# Patient Record
Sex: Male | Born: 1935 | Race: White | Hispanic: No | Marital: Married | Smoking: Never smoker
Health system: Southern US, Community
[De-identification: ages and names within clinical notes are randomized; demographics above are authoritative.]

## PROBLEM LIST (undated history)

## (undated) DIAGNOSIS — C61 Malignant neoplasm of prostate: Secondary | ICD-10-CM

## (undated) DIAGNOSIS — I5042 Chronic combined systolic (congestive) and diastolic (congestive) heart failure: Secondary | ICD-10-CM

## (undated) DIAGNOSIS — I255 Ischemic cardiomyopathy: Secondary | ICD-10-CM

## (undated) DIAGNOSIS — N183 Chronic kidney disease, stage 3 unspecified: Secondary | ICD-10-CM

## (undated) DIAGNOSIS — E785 Hyperlipidemia, unspecified: Secondary | ICD-10-CM

## (undated) DIAGNOSIS — I110 Hypertensive heart disease with heart failure: Secondary | ICD-10-CM

## (undated) DIAGNOSIS — N200 Calculus of kidney: Secondary | ICD-10-CM

## (undated) DIAGNOSIS — I2510 Atherosclerotic heart disease of native coronary artery without angina pectoris: Secondary | ICD-10-CM

## (undated) DIAGNOSIS — I4819 Other persistent atrial fibrillation: Secondary | ICD-10-CM

## (undated) DIAGNOSIS — Z9289 Personal history of other medical treatment: Secondary | ICD-10-CM

## (undated) DIAGNOSIS — I10 Essential (primary) hypertension: Secondary | ICD-10-CM

## (undated) DIAGNOSIS — E119 Type 2 diabetes mellitus without complications: Secondary | ICD-10-CM

## (undated) HISTORY — PX: CARDIAC CATHETERIZATION: SHX172

## (undated) HISTORY — PX: PROSTATE BIOPSY: SHX241

## (undated) HISTORY — PX: CATARACT EXTRACTION W/ INTRAOCULAR LENS  IMPLANT, BILATERAL: SHX1307

## (undated) HISTORY — DX: Hypertensive heart disease with heart failure: I11.0

## (undated) HISTORY — DX: Essential (primary) hypertension: I10

## (undated) HISTORY — DX: Personal history of other medical treatment: Z92.89

## (undated) HISTORY — PX: LAPAROSCOPIC CHOLECYSTECTOMY: SUR755

## (undated) HISTORY — DX: Other persistent atrial fibrillation: I48.19

## (undated) HISTORY — DX: Atherosclerotic heart disease of native coronary artery without angina pectoris: I25.10

## (undated) HISTORY — DX: Chronic combined systolic (congestive) and diastolic (congestive) heart failure: I50.42

## (undated) HISTORY — DX: Hyperlipidemia, unspecified: E78.5

---

## 2004-08-04 ENCOUNTER — Ambulatory Visit: Payer: Self-pay | Admitting: Internal Medicine

## 2005-06-18 DIAGNOSIS — I2510 Atherosclerotic heart disease of native coronary artery without angina pectoris: Secondary | ICD-10-CM

## 2005-06-18 HISTORY — DX: Atherosclerotic heart disease of native coronary artery without angina pectoris: I25.10

## 2005-06-18 HISTORY — PX: CORONARY ANGIOPLASTY WITH STENT PLACEMENT: SHX49

## 2005-08-06 ENCOUNTER — Ambulatory Visit: Payer: Self-pay | Admitting: Cardiology

## 2005-08-07 ENCOUNTER — Ambulatory Visit: Payer: Self-pay

## 2005-08-16 ENCOUNTER — Ambulatory Visit: Payer: Self-pay | Admitting: *Deleted

## 2005-08-17 ENCOUNTER — Ambulatory Visit: Payer: Self-pay | Admitting: Internal Medicine

## 2005-08-17 ENCOUNTER — Ambulatory Visit (HOSPITAL_COMMUNITY): Admission: RE | Admit: 2005-08-17 | Discharge: 2005-08-17 | Payer: Self-pay | Admitting: Orthopedic Surgery

## 2005-08-20 ENCOUNTER — Ambulatory Visit (HOSPITAL_COMMUNITY): Admission: RE | Admit: 2005-08-20 | Discharge: 2005-08-21 | Payer: Self-pay | Admitting: Cardiology

## 2005-08-20 ENCOUNTER — Ambulatory Visit: Payer: Self-pay | Admitting: Cardiology

## 2005-08-29 ENCOUNTER — Ambulatory Visit: Payer: Self-pay | Admitting: Cardiology

## 2005-12-26 ENCOUNTER — Ambulatory Visit: Payer: Self-pay | Admitting: *Deleted

## 2006-04-30 ENCOUNTER — Ambulatory Visit: Payer: Self-pay | Admitting: *Deleted

## 2006-06-14 ENCOUNTER — Ambulatory Visit: Payer: Self-pay

## 2006-10-02 ENCOUNTER — Ambulatory Visit: Payer: Self-pay | Admitting: *Deleted

## 2007-08-14 ENCOUNTER — Ambulatory Visit: Payer: Self-pay | Admitting: Cardiovascular Disease

## 2007-11-27 ENCOUNTER — Ambulatory Visit: Payer: Self-pay

## 2008-08-17 ENCOUNTER — Encounter: Payer: Self-pay | Admitting: Cardiovascular Disease

## 2008-08-17 ENCOUNTER — Ambulatory Visit: Payer: Self-pay | Admitting: Cardiovascular Disease

## 2008-08-17 DIAGNOSIS — E785 Hyperlipidemia, unspecified: Secondary | ICD-10-CM | POA: Insufficient documentation

## 2008-08-17 DIAGNOSIS — I2510 Atherosclerotic heart disease of native coronary artery without angina pectoris: Secondary | ICD-10-CM

## 2008-08-17 DIAGNOSIS — I110 Hypertensive heart disease with heart failure: Secondary | ICD-10-CM | POA: Insufficient documentation

## 2008-08-17 HISTORY — DX: Hypertensive heart disease with heart failure: I11.0

## 2009-08-09 ENCOUNTER — Telehealth: Payer: Self-pay | Admitting: Cardiovascular Disease

## 2009-08-19 DIAGNOSIS — I739 Peripheral vascular disease, unspecified: Secondary | ICD-10-CM | POA: Insufficient documentation

## 2009-08-19 DIAGNOSIS — E119 Type 2 diabetes mellitus without complications: Secondary | ICD-10-CM | POA: Insufficient documentation

## 2009-08-23 ENCOUNTER — Telehealth (INDEPENDENT_AMBULATORY_CARE_PROVIDER_SITE_OTHER): Payer: Self-pay | Admitting: *Deleted

## 2009-08-23 ENCOUNTER — Encounter: Payer: Self-pay | Admitting: *Deleted

## 2009-08-24 ENCOUNTER — Ambulatory Visit: Payer: Self-pay | Admitting: Internal Medicine

## 2009-08-24 ENCOUNTER — Ambulatory Visit: Payer: Self-pay

## 2009-08-24 ENCOUNTER — Encounter (HOSPITAL_COMMUNITY): Admission: RE | Admit: 2009-08-24 | Discharge: 2009-10-18 | Payer: Self-pay | Admitting: Cardiovascular Disease

## 2009-08-26 ENCOUNTER — Ambulatory Visit: Payer: Self-pay | Admitting: Cardiovascular Disease

## 2010-06-21 ENCOUNTER — Telehealth: Payer: Self-pay | Admitting: Cardiovascular Disease

## 2010-07-18 NOTE — Assessment & Plan Note (Signed)
Summary: f1y   Visit Type:  1 year follow up Primary Provider:  Lajean Saver  CC:  No cardiac complaints.  History of Present Illness: the patient is a 76 year old gentleman with coronary artery disease who underwent PCI with a drug-eluting stent in the LAD in 2007. Since he was asymptomatic at initial presentation, he has undergone serial Myoview scanning since then. Recent results were reviewed and the findings were a low risk result without significant ischemia. His exercise tolerance was fairly poor and he developed transient SVT in recovery (beta blocker was held prior to the study).  The patient continues to do well from a cardiac standpoint. He has no symptoms at present. He specifically denies chest pain, dyspnea, edema, lightheadedness, palpitations, or near syncope. He lives in [facility] and maintains an active lifestyle. He plays golf at least twice/week. He also walks 2 miles every day, taking him about 40 minutes.  Current Medications (verified): 1)  Altace 10 Mg Caps (Ramipril) .... Take 1 Capsule By Mouth Once A Day 2)  Co-Enzyme Q-10 30 Mg Caps (Coenzyme Q10) .... Take 1 Capsule By Mouth Once A Day 3)  Plavix 75 Mg Tabs (Clopidogrel Bisulfate) .... Take One Tablet By Mouth Daily 4)  Metformin Hcl 500 Mg Tabs (Metformin Hcl) .... Take 1 Tablet By Mouth Three Times A Day 5)  Lipitor 20 Mg Tabs (Atorvastatin Calcium) .... Take One Tablet By Mouth Daily. 6)  Toprol Xl 50 Mg Xr24h-Tab (Metoprolol Succinate) .... Take 1 Tablet By Mouth Once A Day 7)  Glipizide 5 Mg Tabs (Glipizide) .... Take One Tab P.o. Two Times A Day 8)  Aspirin 81 Mg Tbec (Aspirin) .... Take One Tablet By Mouth Daily 9)  Zyrtec-D Allergy & Congestion 5-120 Mg Xr12h-Tab (Cetirizine-Pseudoephedrine) .... As Needed 10)  Tylenol Extra Strength 500 Mg Tabs (Acetaminophen) .... As Needed  Allergies (verified): No Known Drug Allergies  Past History:  Past medical history reviewed for relevance to current  acute and chronic problems.  Past Medical History: Current Problems:  CAD, NATIVE VESSEL (ICD-414.01)- PCI with a drug-eluting stent 2007 to treat severe LAD stenosis HYPERTENSION, BENIGN (ICD-401.1) HYPERLIPIDEMIA-MIXED (ICD-272.4) DIABETES MELLITUS, TYPE II (ICD-250.00)  Review of Systems       Negative except as per HPI   Vital Signs:  Patient profile:   76 year old male Height:      71 inches Weight:      213.50 pounds BMI:     29.88 Pulse rate:   68 / minute Pulse rhythm:   regular Resp:     18 per minute BP sitting:   150 / 90  (left arm)  Vitals Entered By: Vikki Ports (August 26, 2009 10:09 AM)  Physical Exam  General:  Pt is alert and oriented, in no acute distress. HEENT: normal Neck: normal carotid upstrokes without bruits, JVP normal Lungs: CTA CV: RRR without murmur or gallop Abd: soft, NT, positive BS, no bruit, no organomegaly Ext: no clubbing, cyanosis, or edema. peripheral pulses 2+ and equal Skin: warm and dry without rash    Impression & Recommendations:  Problem # 1:  CAD, NATIVE VESSEL (ICD-414.01) Stable without angina. Recent Myoview stress test negative for ischemia. He is several years out from PCI and I advised he could discontinue plavix at this point. We discussed risks/benefit of continued plavix, including the very low chance of late stent thrombosis versus increased bleeding risk if he continues long-term dual antiplatelet Rx. He wishes to discontinue the drug. Will f/u in  one year with a nonimaging exercise treadmill stress test to be the same day as his office visit since he lives out of town.  The following medications were removed from the medication list:    Plavix 75 Mg Tabs (Clopidogrel bisulfate) .Marland Kitchen... Take one tablet by mouth daily His updated medication list for this problem includes:    Altace 10 Mg Caps (Ramipril) .Marland Kitchen... Take 1 capsule by mouth once a day    Toprol Xl 50 Mg Xr24h-tab (Metoprolol succinate) .Marland Kitchen... Take 1 tablet  by mouth once a day    Aspirin 81 Mg Tbec (Aspirin) .Marland Kitchen... Take one tablet by mouth daily  Problem # 2:  HYPERTENSION, BENIGN (ICD-401.1) BP elevated today but he reports good BP control at home with BP's averaging 120's over 60's. States office BP at his PCP's office 2 weeks ago was within normal limits. Monitor for now.  His updated medication list for this problem includes:    Altace 10 Mg Caps (Ramipril) .Marland Kitchen... Take 1 capsule by mouth once a day    Toprol Xl 50 Mg Xr24h-tab (Metoprolol succinate) .Marland Kitchen... Take 1 tablet by mouth once a day    Aspirin 81 Mg Tbec (Aspirin) .Marland Kitchen... Take one tablet by mouth daily  BP today: 150/90 Prior BP: 120/80 (08/17/2008)  Problem # 3:  HYPERLIPIDEMIA-MIXED (ICD-272.4) Managed by Dr Venetia Night. LDL goal less than 100 mg/dL.  His updated medication list for this problem includes:    Lipitor 20 Mg Tabs (Atorvastatin calcium) .Marland Kitchen... Take one tablet by mouth daily.  Patient Instructions: 1)  Your physician has recommended you make the following change in your medication: STOP Plavix 2)  Your physician wants you to follow-up in:  1 YEAR.  You will receive a reminder letter in the mail two months in advance. If you don't receive a letter, please call our office to schedule the follow-up appointment. 3)  Your physician has requested that you have an exercise tolerance test in 1 YEAR.  For further information please visit [email or web address]  Please also follow instruction sheet, as given. Prescriptions: LIPITOR 20 MG TABS (ATORVASTATIN CALCIUM) Take one tablet by mouth daily.  #90 x 3   Entered by:   Julieta Gutting, RN, BSN   Authorized by:   Norva Karvonen, MD   Signed by:   Julieta Gutting, RN, BSN on 08/26/2009   Method used:   Electronically to        [facility]* (mail-order)             ,          Ph: [phone]       Fax: [phone]   RxID:   [id number] TOPROL XL 50 MG XR24H-TAB (METOPROLOL SUCCINATE) Take 1 tablet by mouth once a day  #90 x  3   Entered by:   Vikki Ports   Authorized by:   Norva Karvonen, MD   Signed by:   Vikki Ports on 08/26/2009   Method used:   Print then Give to Patient   RxID:   [id number] ALTACE 10 MG CAPS (RAMIPRIL) Take 1 capsule by mouth once a day  #90 x 3   Entered by:   Vikki Ports   Authorized by:   Norva Karvonen, MD   Signed by:   Vikki Ports on 08/26/2009   Method used:   Print then Give to Patient   RxID:   [id number]

## 2010-07-18 NOTE — Progress Notes (Signed)
Summary: stress test (3/11 appt)  Phone Note Call from Patient Call back at Home Phone [phone]   Caller: Patient Reason for Call: Talk to Nurse, Talk to Doctor Summary of Call: dose pt need to have stress test prior to annual office visit Initial call taken by: Omer Jack,  August 09, 2009 4:45 PM  Follow-up for Phone Call        yes - pt was asymptomatic at presentation and has moderate residual CAD Follow-up by: Norva Karvonen, MD,  August 15, 2009 1:37 PM  Additional Follow-up for Phone Call Additional follow up Details #1::        I spoke with the pt and Dr Excell Seltzer would like him to have an Exercise Stress Myoview prior to his appt on 08/26/09.  I have already given the pt instructions for this test verbally over the phone.  The pt is coming from [facility] for his appt and would like to get this test on 3/9 or 3/10.  I will place the order and have a scheduler contact the pt.  Additional Follow-up by: Julieta Gutting, RN, BSN,  August 16, 2009 5:38 PM

## 2010-07-18 NOTE — Progress Notes (Signed)
Summary: Nuclear Pre-Procedure  Phone Note Outgoing Call Call back at Home Phone [phone]   Call placed by: Stanton Kidney, EMT-P,  August 23, 2009 3:01 PM Action Taken: Phone Call Completed Summary of Call: Left message with information on Myoview Information Sheet (see scanned document for details).     Nuclear Med Background Indications for Stress Test: Evaluation for Ischemia, Stent Patency, PTCA Patency   History: Angioplasty, Heart Catheterization, Myocardial Perfusion Study, Stents  History Comments: '98 Angioplasty: LAD 3/07 Heart Cath: EF= 55-60%, LAD 90% > LAD Stent 6/09 MPS: EF=53%, low risk     Nuclear Pre-Procedure Cardiac Risk Factors: Family History - CAD, Hypertension, Lipids, NIDDM Height (in): 71

## 2010-07-18 NOTE — Assessment & Plan Note (Signed)
Summary: Cardiology Nuclear Study  Nuclear Med Background Indications for Stress Test: Evaluation for Ischemia, Stent Patency, PTCA Patency   History: Angioplasty, Heart Catheterization, Myocardial Perfusion Study, Stents  History Comments: '98 Angioplasty: LAD 3/07 Heart Cath: EF= 55-60%, LAD 90% > LAD Stent 6/09 MPS: EF=53%, low risk  Symptoms: DOE, Palpitations    Nuclear Pre-Procedure Cardiac Risk Factors: Family History - CAD, Hypertension, Lipids, NIDDM Caffeine/Decaff Intake: None NPO After: 8:00 PM Lungs: clear IV 0.9% NS with Angio Cath: 20g     IV Site: (R) AC IV Started by: Stanton Kidney EMT-P Chest Size (in) 46     Height (in): 71 Weight (lb): 208 BMI: 29.11 Tech Comments: Toprol held > 24 hours, per Patient.    The patient went into psvt as soon as he went into recovery. CBG = 138 @ 7:30 am this day.          Vagal maneuvers were used to convert the patient back into                                                                a NRS.  Nuclear Med Study 1 or 2 day study:  1 day     Stress Test Type:  Stress Reading MD:  Arvilla Meres, MD     Referring MD:  M.Cooper Resting Radionuclide:  Technetium 32m Tetrofosmin     Resting Radionuclide Dose:  11.0 mCi  Stress Radionuclide:  Technetium 56m Tetrofosmin     Stress Radionuclide Dose:  33.0 mCi   Stress Protocol Exercise Time (min):  4:45 min     Max HR:  142 bpm     Predicted Max HR:  148 bpm  Max Systolic BP: 193 mm Hg     Percent Max HR:  95.95 %     METS: 6.6 Rate Pressure Product:  74259    Stress Test Technologist:  Milana Na EMT-P     Nuclear Technologist:  Burna Mortimer Deal RT-N  Rest Procedure  Myocardial perfusion imaging was performed at rest 45 minutes following the intravenous administration of Myoview Technetium 56m Tetrofosmin.  Stress Procedure  The patient exercised for 4:45. The patient stopped due to fatifue, sob, and denied any chest pain.  There were no significant ST-T wave changes.  There were freq pvcs, multifocal, cuplets, and rare pacs. As soon as the patient went into recovery, he went into psvt. Myoview was injected at peak exercise and myocardial perfusion imaging was performed after a brief delay.  QPS Raw Data Images:  movPatient motion noted; appropriate software correction applied. Stress Images:  Inferior thinning Rest Images:  Inferior thinning Subtraction (SDS):  Mild thinning of the inferior wall that improves somewhat with stress. Likely diaphragmatic attenuation. No ischemia. Transient Ischemic Dilatation:  .93  (Normal <1.22)  Lung/Heart Ratio:  .31  (Normal <0.45)  Quantitative Gated Spect Images QGS EDV:  93 ml QGS ESV:  38 ml QGS EF:  59 % QGS cine images:  Normal  Findings Low risk nuclear study  Evidence for inferior infarct     Overall Impression  Exercise Capacity: Good exercise capacity. BP Response: Normal blood pressure response. Clinical Symptoms: Dyspnea ECG Impression: No significant ST segment change suggestive of ischemia. +PVCs and couplets. Overall Impression: Low risk stress nuclear study.  Overall Impression Comments: Mild thinning of the inferior wall that improves somewhat with stress. Likely diaphragmatic attenuation. No ischemia.  Appended Document: Cardiology Nuclear Study Patient developed SVT at 160 bpm in recovery which broke with vagal maneuvers.  Appended Document: Cardiology Nuclear Study reviewed - low risk result.  Appended Document: Cardiology Nuclear Study Pt aware of results at 08/26/09 appointment with Dr Excell Seltzer.

## 2010-07-20 NOTE — Progress Notes (Signed)
Summary: rx refill  Phone Note Refill Request Message from:  Patient on June 21, 2010 8:34 AM  Refills Requested: Medication #1:  LIPITOR 20 MG TABS Take one tablet by mouth daily. care mark # [phone]   Method Requested: Telephone to Pharmacy Initial call taken by: Roe Coombs,  June 21, 2010 8:34 AM  Follow-up for Phone Call        Rx faxed to pharmacy. Vikki Ports  June 21, 2010 9:26 AM     Prescriptions: LIPITOR 20 MG TABS (ATORVASTATIN CALCIUM) Take one tablet by mouth daily.  #90 x 3   Entered by:   Vikki Ports   Authorized by:   Norva Karvonen, MD   Signed by:   Vikki Ports on 06/21/2010   Method used:   Faxed to ...       [facility]* (mail-order)       [address] [place], [place]  [address]       Ph: [phone]       Fax: [phone]   RxID:   [id number]

## 2010-08-29 ENCOUNTER — Encounter (INDEPENDENT_AMBULATORY_CARE_PROVIDER_SITE_OTHER): Payer: Medicare Other | Admitting: Cardiovascular Disease

## 2010-08-29 ENCOUNTER — Encounter (INDEPENDENT_AMBULATORY_CARE_PROVIDER_SITE_OTHER): Payer: Medicare Other

## 2010-08-29 ENCOUNTER — Encounter: Payer: Self-pay | Admitting: Cardiovascular Disease

## 2010-08-29 DIAGNOSIS — I2510 Atherosclerotic heart disease of native coronary artery without angina pectoris: Secondary | ICD-10-CM

## 2010-08-29 DIAGNOSIS — R0989 Other specified symptoms and signs involving the circulatory and respiratory systems: Secondary | ICD-10-CM

## 2010-09-05 NOTE — Miscellaneous (Signed)
Summary: Follow-up from [facility]  Clinical Lists Changes  Medications: Changed medication from ALTACE 10 MG CAPS (RAMIPRIL) Take 1 capsule by mouth once a day to ALTACE 10 MG CAPS (RAMIPRIL) Take 1 capsule by mouth once a day - Signed Changed medication from TOPROL XL 50 MG XR24H-TAB (METOPROLOL SUCCINATE) Take 1 tablet by mouth once a day to TOPROL XL 50 MG XR24H-TAB (METOPROLOL SUCCINATE) Take 1 tablet by mouth once a day - Signed Changed medication from LIPITOR 20 MG TABS (ATORVASTATIN CALCIUM) Take one tablet by mouth daily. to LIPITOR 20 MG TABS (ATORVASTATIN CALCIUM) Take one tablet by mouth daily. - Signed Rx of ALTACE 10 MG CAPS (RAMIPRIL) Take 1 capsule by mouth once a day;  #90 x 3;  Signed;  Entered by: Julieta Gutting, RN, BSN;  Authorized by: Norva Karvonen, MD;  Method used: Electronically to [facility]*, , ,   , Ph: [phone], Fax: [phone] Rx of TOPROL XL 50 MG XR24H-TAB (METOPROLOL SUCCINATE) Take 1 tablet by mouth once a day;  #90 x 3;  Signed;  Entered by: Julieta Gutting, RN, BSN;  Authorized by: Norva Karvonen, MD;  Method used: Electronically to [facility]*, , ,   , Ph: [phone], Fax: [phone] Rx of ALTACE 10 MG CAPS (RAMIPRIL) Take 1 capsule by mouth once a day;  #90 x 3;  Signed;  Entered by: Julieta Gutting, RN, BSN;  Authorized by: Norva Karvonen, MD;  Method used: Print then Give to Patient Rx of LIPITOR 20 MG TABS (ATORVASTATIN CALCIUM) Take one tablet by mouth daily.;  #90 x 3;  Signed;  Entered by: Julieta Gutting, RN, BSN;  Authorized by: Norva Karvonen, MD;  Method used: Print then Give to Patient Rx of TOPROL XL 50 MG XR24H-TAB (METOPROLOL SUCCINATE) Take 1 tablet by mouth once a day;  #90 x 3;  Signed;  Entered by: Julieta Gutting, RN, BSN;  Authorized by: Norva Karvonen, MD;  Method used: Print then Give to Patient Observations: Added new observation of PI CARDIO: Your physician wants you to follow-up in:  1 YEAR.  You will receive a  reminder letter in the mail two months in advance. If you don't receive a letter, please call our office to schedule the follow-up appointment. Your physician has requested that you have a Lexiscan myoview in 1 YEAR.  For further information please visit [email or web address]  Please follow instruction sheet, as given. Your physician recommends that you continue on your current medications as directed. Please refer to the Current Medication list given to you today. (08/29/2010 11:38)    Prescriptions: TOPROL XL 50 MG XR24H-TAB (METOPROLOL SUCCINATE) Take 1 tablet by mouth once a day  #90 x 3   Entered by:   Julieta Gutting, RN, BSN   Authorized by:   Norva Karvonen, MD   Signed by:   Julieta Gutting, RN, BSN on 08/29/2010   Method used:   Print then Give to Patient   RxID:   [id number] LIPITOR 20 MG TABS (ATORVASTATIN CALCIUM) Take one tablet by mouth daily.  #90 x 3   Entered by:   Julieta Gutting, RN, BSN   Authorized by:   Norva Karvonen, MD   Signed by:   Julieta Gutting, RN, BSN on 08/29/2010   Method used:   Print then Give to Patient   RxID:   [id number] ALTACE 10 MG CAPS (RAMIPRIL) Take 1 capsule by mouth once a day  #90 x 3   Entered  by:   Julieta Gutting, RN, BSN   Authorized by:   Norva Karvonen, MD   Signed by:   Julieta Gutting, RN, BSN on 08/29/2010   Method used:   Print then Give to Patient   RxID:   [id number] TOPROL XL 50 MG XR24H-TAB (METOPROLOL SUCCINATE) Take 1 tablet by mouth once a day  #90 x 3   Entered by:   Julieta Gutting, RN, BSN   Authorized by:   Norva Karvonen, MD   Signed by:   Julieta Gutting, RN, BSN on 08/29/2010   Method used:   Electronically to        [facility]* (retail)             ,          Ph: [phone]       Fax: [phone]   RxID:   [id number] ALTACE 10 MG CAPS (RAMIPRIL) Take 1 capsule by mouth once a day  #90 x 3   Entered by:   Julieta Gutting, RN, BSN   Authorized by:   Norva Karvonen, MD   Signed  by:   Julieta Gutting, RN, BSN on 08/29/2010   Method used:   Electronically to        [facility]* (retail)             ,          Ph: [phone]       Fax: [phone]   RxID:   [id number]   I sent Toprol XL and Altace prescription to [facility] in error. I contacted [facility] and spoke with [facility] pharmacy tech and the pt's account with [facility] is inactive.  Because the account is inactive the prescriptions will not be generated.  Julieta Gutting, RN, BSN  August 29, 2010 11:46 AM    Patient Instructions: 1)  Your physician wants you to follow-up in:  1 YEAR.  You will receive a reminder letter in the mail two months in advance. If you don't receive a letter, please call our office to schedule the follow-up appointment. 2)  Your physician has requested that you have a Lexiscan myoview in 1 YEAR.  For further information please visit [email or web address]  Please follow instruction sheet, as given. 3)  Your physician recommends that you continue on your current medications as directed. Please refer to the Current Medication list given to you today.

## 2010-10-31 NOTE — Assessment & Plan Note (Signed)
[facility]                            CARDIOLOGY OFFICE NOTE   Rebecca, Motta CLETUS PARIS                        MRN:          [id number]  DATE:08/14/2007                            DOB:          [birth year]    Blakeley Margraf was seen at the [facility] Cardiology office on August 14, 2007.  Mr. Negro is a very nice, 76 year old gentleman with coronary  artery disease.  He underwent PCI with a drug-eluting stent to treat  severe LAD stenosis back in 2007.  Mr. Velardi has type 2 diabetes and at  his presentation, really had minimal symptoms.  He had a followup stress  nuclear study in December 2007, that showed no evidence of scar or  ischemia.   Mr. Pilch is doing quite well from a symptomatic standpoint.  He walks  and plays golf regularly and really has no symptoms.  He denies chest  pain, dyspnea, orthopnea, PND, edema, palpitations, lightheadedness or  syncope.  He lives in [facility] and is up here visiting his son  who lives in [place].  He has previously been followed by Dr. Glennon Hamilton and I will be following up with him for his cardiac care from  here forward.   CURRENT MEDICATIONS:  1. Altace 10 mg daily.  2. Coenzyme Q 10 daily.  3. Aspirin 325 mg daily.  4. Plavix 75 mg daily.  5. Lipitor 20 mg daily.  6. Toprol XL 50 mg daily.  7. Glipizide 2.5 mg twice daily.  8. Fish oil daily.   ALLERGIES:  No known drug allergies.   PHYSICAL EXAMINATION:  GENERAL:  The patient is alert and oriented in no  acute distress.  VITAL SIGNS:  Weight 211, blood pressure 140/80, heart rate 65,  respiratory rate 16.  HEENT:  Normal.  NECK:  Normal carotid upstrokes without bruits.  Jugular venous pressure  normal.  LUNGS:  Clear to auscultation bilaterally.  HEART:  Regular rate and rhythm without murmurs or gallops.  ABDOMEN:  Soft, obese, nontender, no bruits.  EXTREMITIES:  No clubbing, cyanosis or edema.  Peripheral pulses 2+ and  equal  throughout.   EKG shows normal sinus rhythm with left axis deviation, otherwise within  normal limits.   ASSESSMENT/PLAN:  Coronary artery disease, status post drug-eluting  stent, treatment of severe proximal left anterior descending stenosis in  2007.  Mr. Revak is doing well.  He is tolerating his medical therapy  without difficulty.  I have asked him to reduce his aspirin dose to 81  mg daily since he is over 1 year out from his percutaneous coronary  intervention procedure.  I would like him to remain on dual antiplatelet  therapy with aspirin and Plavix since he is tolerating it well.  Since  he was asymptomatic at the time of his initial presentation and had  severe left anterior descending stenosis, I think he warrants followup  stress testing.  We scheduled him for an exercise Myoview study in  approximately 3 months.  I would like to follow him on a yearly basis  since  he has regular care with his primary physician, Dr. Venetia Night.   For remaining problems including hypertension, dyslipidemia and type 2  diabetes, he is followed closely by Dr. Venetia Night who manages these  problems.  He requested prescriptions for his regular medications which  were written for him today.     Veverly Fells. Excell Seltzer, MD  Electronically Signed    MDC/MedQ  DD: 08/14/2007  DT: 08/15/2007  Job #: [id number]   cc:   Susanne Greenhouse

## 2010-11-03 NOTE — Discharge Summary (Signed)
NAMEHARTMAN, MINAHAN                 ACCOUNT NO.:  [id number]   MEDICAL RECORD NO.:  [id number]          PATIENT TYPE:  OIB   LOCATION:  6526                         FACILITY:  [facility]   PHYSICIAN:  Salvadore Farber, M.D. LHCDATE OF BIRTH:  12-28-1934   DATE OF ADMISSION:  08/20/2005  DATE OF DISCHARGE:  08/21/2005                                 DISCHARGE SUMMARY   PROCEDURES:  1.  Catheterization.  2.  Percutaneous intervention with a drug-eluting stent to 1 vessel.  3.  [employer].   PRIMARY DIAGNOSIS:  Abnormal Myoview.   SECONDARY DIAGNOSES:  1.  Status post percutaneous transluminal coronary angioplasty to a 90% left      anterior descending in 1998.  2.  Residual coronary artery disease of 90% in a subbranch of the obtuse      marginal, medical therapy.  3.  Preserved left ventricular function.  4.  Paroxysmal supraventricular tachycardia and a 4-beat run of ventricular      tachycardia post exercise.  5.  Hyperlipidemia.  6.  Non-insulin-dependent diabetes.  7.  Family history of coronary artery disease.  8.  Hypertension.  9.  History of intestinal obstruction.   HOSPITAL COURSE:  Mr. Perreira is a 76 year old male with known coronary artery  disease.  He was seen in the office and referred for outpatient Myoview;  this was abnormal, so he came in for a joint-venture cardiac  catheterization.  The cardiac catheterization showed high-grade stenosis in  the LAD and he came back to the hospital on August 20, 2005 for percutaneous  intervention.   Mr. Kall had a drug-eluting stent to the mid LAD with a CYPHER stent,  reducing the stenosis from 90% to 0%.  He had no other critical coronary  artery disease and his EF was within normal limits.   The next day, his post-procedure labs were stable and his EKG was unchanged.  His groin was without hematoma or bruit.  Mr. Mathieson was considered stable for  discharge on August 22, 2005 with outpatient followup arranged.  At that time,  Dr.  Gabriel Rung can review his labs and decide if an increased statin dose is  needed.   DISCHARGE INSTRUCTIONS:  1.  His activity level is to be increased gradually.  2.  He is to stick to a low-fat diabetic diet.  3.  He is to call the office for problems with the cath site.  4.  He is to follow up with Dr. Glennon Hamilton on August 29, 2005 at 9:45.  5.  He is to follow up with his family physician in [facility] as needed      or as scheduled.   DISCHARGE MEDICATIONS:  1.  Atenolol 25 mg a day.  2.  Glipizide 2.5 mg a day.  3.  Altace 10 mg a day.  4.  Aspirin 325 mg a day.  5.  Plavix 75 mg a day.  6.  Lipitor 10 mg a day.      Theodore Demark, P.A. [facility]      Salvadore Farber, M.D. [facility]  Electronically Signed    RB/MEDQ  D:  08/21/2005  T:  08/22/2005  Job:  [id number]   cc:   Alan Ripper, Kihei Vertis Kelch MD

## 2010-11-03 NOTE — Cardiovascular Report (Signed)
NAMEVERMON, GRAYS                 ACCOUNT NO.:  [id number]   MEDICAL RECORD NO.:  [id number]          PATIENT TYPE:  OIB   LOCATION:  6526                         FACILITY:  [facility]   PHYSICIAN:  Salvadore Farber, M.D. LHCDATE OF BIRTH:  [birth year]   DATE OF PROCEDURE:  08/20/2005  DATE OF DISCHARGE:  08/21/2005                              CARDIAC CATHETERIZATION   PROCEDURE:  Drug-eluting stent placement in the mid LAD.   INDICATIONS:  Mr. Pekala is a 76 year old gentleman with longstanding coronary  disease with a prior balloon angioplasty of the mid LAD. He recently had a  stress test markedly positive for anterior ischemia at relatively low  workload. Because of that, he underwent diagnostic angiography last week by  Dr. Riley Kill. That demonstrated a severe stenosis of the mid LAD. He is  referred for percutaneous revascularization, given the large territory  ischemia.   PROCEDURAL TECHNIQUE:  Informed consent was obtained. Under 1% lidocaine  local anesthesia, a 6-French sheath was placed in the right common femoral  artery using modified Seldinger technique. Anticoagulation was initiated  with bivalirudin.  ACT was confirmed to be greater than 225 seconds. The  patient had been preloaded with both aspirin and Plavix.   A 6-French CLS 3.5 guide was advanced over a wire and engaged in the ostium  of the left main. A Prowater wire was advanced into the distal LAD without  difficulty. The lesion was predilated using a 2.25 x 12 mm Maverick at 8  atmospheres. I then administered intracoronary nitroglycerin to better  assess the size of the distal LAD. The distal vessel was in the range of 2  to perhaps 2.25 mm. I elected to leave untreated the lesion just after the  second diagonal branch, as doing so would require a lengthy bare metal stent  due to the small size of the distal vessel. It would also potentially  compromise the large diagonal. I therefore chose to stent only the  proximal  portion, establishing flow into the diagonal, leaving at least a moderate  stenosis in the LAD downstream. I thus placed in 2.5 x 23 mm CYPHER and  deployed it at 14 atmospheres. I then post dilated the distal portion of  this using a 2.5 x 20 mm Quantum at 18 atmospheres and the proximal portion  of the stent using the same balloon at 24 atmospheres. Final angiography  demonstrated no residual stenosis, no dissection, and TIMI-3 flow to the  distal vasculature.   The arteriotomy was then closed using a StarClose device. Complete  hemostasis was obtained. He was transferred to holding room in stable  condition.   COMPLICATIONS:  None.   IMPRESSION/PLAN:  Successful percutaneous intervention on the mid left  anterior descending using a drug-eluting stent and reducing stenosis from  90% to 0%. Due to the drug-eluting stent, the patient should be maintained  on a combination of aspirin and Plavix indefinitely.      Salvadore Farber, M.D. [facility]  Electronically Signed     WED/MEDQ  D:  08/20/2005  T:  08/21/2005  Job:  [id number]  cc:   Dr. Georgeann Oppenheim [place], [place]   Cecil Cranker, M.D.  [address]  [address]  [place]  [place] [address]

## 2010-11-03 NOTE — Letter (Signed)
October 02, 2006     RE:  HIROSHI, KRUMMEL  MRN:  [id number]  /  DOB:  16-Oct-1934   ADDENDUM:   Stress Myoview in December 2007, revealed no scar ischemia.    Sincerely,      E. Graceann Congress, MD, [facility]    EJL/MedQ  DD: 10/02/2006  DT: 10/02/2006  Job #: [id number]

## 2010-12-19 ENCOUNTER — Encounter: Payer: Self-pay | Admitting: Cardiovascular Disease

## 2011-01-19 ENCOUNTER — Other Ambulatory Visit: Payer: Self-pay | Admitting: *Deleted

## 2011-01-19 MED ORDER — METOPROLOL SUCCINATE ER 50 MG PO TB24: 50.0000 mg | ORAL_TABLET | Freq: Every day | ORAL | Status: DC

## 2011-08-16 ENCOUNTER — Encounter: Payer: Self-pay | Admitting: *Deleted

## 2011-09-06 ENCOUNTER — Ambulatory Visit (INDEPENDENT_AMBULATORY_CARE_PROVIDER_SITE_OTHER): Payer: Medicare Other | Admitting: Cardiovascular Disease

## 2011-09-06 ENCOUNTER — Encounter: Payer: Self-pay | Admitting: Cardiovascular Disease

## 2011-09-06 VITALS — BP 148/80 | HR 77 | Ht 72.0 in | Wt 211.0 lb

## 2011-09-06 DIAGNOSIS — I10 Essential (primary) hypertension: Secondary | ICD-10-CM

## 2011-09-06 DIAGNOSIS — E785 Hyperlipidemia, unspecified: Secondary | ICD-10-CM

## 2011-09-06 DIAGNOSIS — I2510 Atherosclerotic heart disease of native coronary artery without angina pectoris: Secondary | ICD-10-CM

## 2011-09-06 MED ORDER — HYDROCHLOROTHIAZIDE 25 MG PO TABS: 25.0000 mg | ORAL_TABLET | Freq: Every day | ORAL | Status: DC

## 2011-09-06 MED ORDER — RAMIPRIL 10 MG PO CAPS: 10.0000 mg | ORAL_CAPSULE | Freq: Every day | ORAL | Status: DC

## 2011-09-06 MED ORDER — METOPROLOL SUCCINATE ER 50 MG PO TB24: 50.0000 mg | ORAL_TABLET | Freq: Every day | ORAL | Status: DC

## 2011-09-06 MED ORDER — ATORVASTATIN CALCIUM 20 MG PO TABS: 20.0000 mg | ORAL_TABLET | Freq: Every day | ORAL | Status: DC

## 2011-09-06 NOTE — Assessment & Plan Note (Signed)
The patient is stable without angina. He will continue on aspirin 81 mg daily. He takes atorvastatin for lipid-lowering and he is followed by his primary care physician. Recommend followup in 12 months.

## 2011-09-06 NOTE — Patient Instructions (Signed)
Your physician wants you to follow-up in: 12 months.  You will receive a reminder letter in the mail two months in advance. If you don't receive a letter, please call our office to schedule the follow-up appointment.  Your physician has recommended you make the following change in your medication: Start hydrochlorothiazide 25 mg by mouth daily  Have lab work done in 10-14 days--BMP

## 2011-09-06 NOTE — Assessment & Plan Note (Signed)
Blood pressure control is suboptimal. I recommended the addition of hydrochlorothiazide 25 mg daily. The patient currently takes ramipril and metoprolol.  He should have a metabolic panel checked in a few weeks to make sure that his renal function and potassium are stable and a prescription was written for the patient to have repeat blood work since he lives out of town.

## 2011-09-06 NOTE — Progress Notes (Signed)
HPI:  77 year old gentleman presenting for followup of coronary artery disease and hypertension.  He underwent PCI of a lesion in his LAD back in 2007 with a drug-eluting stent platform. He had exertional dyspnea prior to this but has no history of angina. He stopped Plavix after he was seen last office visit in 2011. The patient's labs are followed by his primary physician in [facility]. He feels well and has no chest pain or pressure. He denies dyspnea, edema, palpitations, or PND. He does some walking for exercise. He plays golf regularly. He notes that his type 2 diabetes has required additional hypoglycemic medication. He's been monitoring home blood pressures and they have been elevated in the 150s to 160s over 80s and 90s.  Outpatient Encounter Prescriptions as of 09/06/2011  Medication Sig Dispense Refill  . acetaminophen (TYLENOL) 500 MG tablet Take 500 mg by mouth every 6 (six) hours as needed.      Marland Kitchen aspirin 81 MG tablet Take 81 mg by mouth daily.      Marland Kitchen atorvastatin (LIPITOR) 20 MG tablet Take 1 tablet (20 mg total) by mouth daily.  90 tablet  3  . cetirizine-pseudoephedrine (ZYRTEC-D ALLERGY & CONGESTION) 5-120 MG per tablet Take 1 tablet by mouth as needed.      Marland Kitchen co-enzyme Q-10 30 MG capsule Take 30 mg by mouth daily.      Marland Kitchen glipiZIDE (GLUCOTROL) 5 MG tablet Take 5 mg by mouth 2 (two) times daily before a meal.      . linagliptin (TRADJENTA) 5 MG TABS tablet Take 5 mg by mouth daily.      . metFORMIN (GLUCOPHAGE) 500 MG tablet Take 500 mg by mouth 4 (four) times daily.       . metoprolol succinate (TOPROL XL) 50 MG 24 hr tablet Take 1 tablet (50 mg total) by mouth daily.  90 tablet  3  . ramipril (ALTACE) 10 MG capsule Take 1 capsule (10 mg total) by mouth daily.  90 capsule  3  . DISCONTD: atorvastatin (LIPITOR) 20 MG tablet Take 20 mg by mouth daily.      Marland Kitchen DISCONTD: metoprolol (TOPROL XL) 50 MG 24 hr tablet Take 1 tablet (50 mg total) by mouth daily.  30 tablet  6  . DISCONTD:  metoprolol succinate (TOPROL XL) 50 MG 24 hr tablet Take 1 tablet (50 mg total) by mouth daily.  90 tablet  3  . DISCONTD: ramipril (ALTACE) 10 MG capsule Take 10 mg by mouth daily.      . hydrochlorothiazide (HYDRODIURIL) 25 MG tablet Take 1 tablet (25 mg total) by mouth daily.  90 tablet  3    No Known Allergies  Past Medical History  Diagnosis Date  . CAD (coronary artery disease) 2007    pci with a drug-eluting stent 2007 to teeat severe lad stenosis  . HTN (hypertension)   . Hyperlipidemia   . Diabetes mellitus     ROS: Negative except as per HPI  BP 148/80  Pulse 77  Ht 6' (1.829 m)  Wt 95.709 kg (211 lb)  BMI 28.62 kg/m2  PHYSICAL EXAM: Pt is alert and oriented, NAD HEENT: normal Neck: JVP - normal, carotids 2+= without bruits Lungs: CTA bilaterally CV: RRR without murmur or gallop Abd: soft, NT, Positive BS, no hepatomegaly Ext: no C/C/E, distal pulses intact and equal Skin: warm/dry no rash  EKG:  Normal sinus rhythm 77 beats per minute, left axis deviation, no other abnormalities.  ASSESSMENT AND PLAN:

## 2011-09-06 NOTE — Assessment & Plan Note (Signed)
Followed by PCP, maintain on atorvastatin with goal LDL less than 100.

## 2012-04-25 ENCOUNTER — Other Ambulatory Visit: Payer: Self-pay | Admitting: Cardiovascular Disease

## 2012-08-13 ENCOUNTER — Other Ambulatory Visit: Payer: Self-pay | Admitting: Cardiovascular Disease

## 2012-09-12 ENCOUNTER — Encounter: Payer: Self-pay | Admitting: Cardiovascular Disease

## 2012-09-12 ENCOUNTER — Ambulatory Visit (INDEPENDENT_AMBULATORY_CARE_PROVIDER_SITE_OTHER): Payer: Medicare Other | Admitting: Cardiovascular Disease

## 2012-09-12 VITALS — BP 122/88 | HR 71 | Ht 71.5 in | Wt 206.8 lb

## 2012-09-12 DIAGNOSIS — I2510 Atherosclerotic heart disease of native coronary artery without angina pectoris: Secondary | ICD-10-CM

## 2012-09-12 MED ORDER — HYDROCHLOROTHIAZIDE 25 MG PO TABS: ORAL_TABLET | ORAL | Status: DC

## 2012-09-12 MED ORDER — ATORVASTATIN CALCIUM 20 MG PO TABS: 20.0000 mg | ORAL_TABLET | Freq: Every day | ORAL | Status: DC

## 2012-09-12 MED ORDER — RAMIPRIL 10 MG PO CAPS: ORAL_CAPSULE | ORAL | Status: DC

## 2012-09-12 MED ORDER — METOPROLOL SUCCINATE ER 50 MG PO TB24: 50.0000 mg | ORAL_TABLET | Freq: Every day | ORAL | Status: DC

## 2012-09-12 NOTE — Progress Notes (Signed)
HPI:  78 year old gentleman presenting for followup of coronary artery disease and hypertension. He underwent PCI of a lesion in his LAD back in 2007 with a drug-eluting stent platform. He had exertional dyspnea prior to this but has no history of angina. He stopped Plavix after he was seen last office visit in 2011.  The patient has had no cardiac problems since I saw him one year ago. He specifically denies chest pain, chest pressure, dyspnea, edema, or palpitations. He was hospitalized in December with a small bowel obstruction. This resolved without the need for surgery. He's been diagnosed with prostate cancer. He has undergone hormone injections and is going to start radiation therapy soon. There are no plans for surgery.  Outpatient Encounter Prescriptions as of 09/12/2012  Medication Sig Dispense Refill  . acetaminophen (TYLENOL) 500 MG tablet Take 500 mg by mouth every 6 (six) hours as needed.      Marland Kitchen aspirin 81 MG tablet Take 81 mg by mouth daily.      Marland Kitchen atorvastatin (LIPITOR) 20 MG tablet Take 1 tablet (20 mg total) by mouth daily.  90 tablet  3  . cetirizine-pseudoephedrine (ZYRTEC-D ALLERGY & CONGESTION) 5-120 MG per tablet Take 1 tablet by mouth as needed.      Marland Kitchen co-enzyme Q-10 30 MG capsule Take 30 mg by mouth daily.      Marland Kitchen glipiZIDE (GLUCOTROL) 5 MG tablet Take 5 mg by mouth 2 (two) times daily before a meal.      . hydrochlorothiazide (HYDRODIURIL) 25 MG tablet TAKE ONE TABLET BY MOUTH EVERY DAY  90 tablet  2  . linagliptin (TRADJENTA) 5 MG TABS tablet Take 5 mg by mouth daily.      . metFORMIN (GLUCOPHAGE) 500 MG tablet Take 500 mg by mouth 4 (four) times daily.       . ramipril (ALTACE) 10 MG capsule TAKE ONE CAPSULE BY MOUTH EVERY DAY  30 capsule  3  . metoprolol succinate (TOPROL XL) 50 MG 24 hr tablet Take 1 tablet (50 mg total) by mouth daily.  90 tablet  3   No facility-administered encounter medications on file as of 09/12/2012.    No Known Allergies  Past Medical  History  Diagnosis Date  . CAD (coronary artery disease) 2007    pci with a drug-eluting stent 2007 to teeat severe lad stenosis  . HTN (hypertension)   . Hyperlipidemia   . Diabetes mellitus     ROS: Negative except as per HPI  BP 122/88  Pulse 71  Ht 5' 11.5" (1.816 m)  Wt 93.804 kg (206 lb 12.8 oz)  BMI 28.44 kg/m2  SpO2 99%  PHYSICAL EXAM: Pt is alert and oriented, NAD HEENT: normal Neck: JVP - normal, carotids 2+= without bruits Lungs: CTA bilaterally CV: RRR without murmur or gallop Abd: soft, NT, Positive BS, no hepatomegaly Ext: no C/C/E, distal pulses intact and equal Skin: warm/dry no rash  EKG:  Normal sinus rhythm with sinus arrhythmia, heart rate 71 beats per minute, left axis deviation and probable age-indeterminate inferior MI.  ASSESSMENT AND PLAN: 1. Coronary artery disease, native vessel. The patient remained stable without anginal symptoms. He has been off of Plavix for several years now. He should remain on his current medical program.  2. Hypertension, essential. Blood pressure is well controlled after addition of hydrochlorothiazide last year. He should remain on ramipril, metoprolol succinate, and hydrochlorothiazide.  3. Hyperlipidemia. He is treated with atorvastatin 20 mg daily. Lipids are followed by his  primary care physician.  For followup extubation back in one year.  Tonny Bollman 09/12/2012 11:31 AM

## 2012-09-12 NOTE — Patient Instructions (Addendum)
Your physician wants you to follow-up in: 1 YEAR with Dr Cooper.  You will receive a reminder letter in the mail two months in advance. If you don't receive a letter, please call our office to schedule the follow-up appointment.  Your physician recommends that you continue on your current medications as directed. Please refer to the Current Medication list given to you today.  

## 2012-09-24 ENCOUNTER — Other Ambulatory Visit: Payer: Self-pay | Admitting: Cardiovascular Disease

## 2012-09-29 ENCOUNTER — Other Ambulatory Visit: Payer: Self-pay | Admitting: *Deleted

## 2012-09-29 DIAGNOSIS — I2510 Atherosclerotic heart disease of native coronary artery without angina pectoris: Secondary | ICD-10-CM

## 2012-09-29 MED ORDER — RAMIPRIL 10 MG PO CAPS: ORAL_CAPSULE | ORAL | Status: DC

## 2012-10-28 ENCOUNTER — Telehealth: Payer: Self-pay | Admitting: Cardiovascular Disease

## 2012-10-28 NOTE — Telephone Encounter (Signed)
I spoke with the pt's wife and she said the pt's PCP stopped HCTZ due to an elevated kidney function. They have scheduled the pt to follow-up again next month.  I made her aware that at this time stopping the HCTZ is appropriate. I instructed her to check the pt's BP 2 times per week to make sure that BP does not get too high.

## 2012-10-28 NOTE — Telephone Encounter (Signed)
New problem    pts wife concerned that [place] ([place] took [place] off of hydrochlorothiazide 25 mg & she wants to know if this is ok to be off of or should it be replaced with something different

## 2012-11-07 ENCOUNTER — Other Ambulatory Visit: Payer: Self-pay | Admitting: Cardiovascular Disease

## 2012-11-11 ENCOUNTER — Telehealth: Payer: Self-pay | Admitting: Cardiovascular Disease

## 2012-11-11 ENCOUNTER — Other Ambulatory Visit: Payer: Self-pay

## 2012-11-11 DIAGNOSIS — I2510 Atherosclerotic heart disease of native coronary artery without angina pectoris: Secondary | ICD-10-CM

## 2012-11-11 MED ORDER — METOPROLOL SUCCINATE ER 50 MG PO TB24: 50.0000 mg | ORAL_TABLET | Freq: Every day | ORAL | Status: DC

## 2012-11-11 NOTE — Telephone Encounter (Signed)
Left message on machine for pt to contact the office.   

## 2012-11-11 NOTE — Telephone Encounter (Signed)
New problem    receive new medication from his oncologist.   Procrit injection weekly . Wife read the literature , calling to discuss.

## 2012-11-12 NOTE — Telephone Encounter (Signed)
Follow-up:    Patient's wife called in to say that procrit was given to him at 20,000.  Please call back.

## 2012-11-12 NOTE — Telephone Encounter (Signed)
I spoke with the pt's wife and she wanted to make [place] aware that pt just started procrit yesterday.  This is being given to the pt by his oncologist.  Currently the pt is undergoing radiation.  I made her aware that the pt is okay to take Procrit and should follow recommendations by oncologist because this is the specialist.

## 2013-09-15 ENCOUNTER — Encounter: Payer: Self-pay | Admitting: Cardiovascular Disease

## 2013-09-15 ENCOUNTER — Ambulatory Visit (INDEPENDENT_AMBULATORY_CARE_PROVIDER_SITE_OTHER): Payer: Medicare Other | Admitting: Cardiovascular Disease

## 2013-09-15 VITALS — BP 124/70 | HR 78 | Ht 71.5 in | Wt 205.0 lb

## 2013-09-15 DIAGNOSIS — E785 Hyperlipidemia, unspecified: Secondary | ICD-10-CM

## 2013-09-15 DIAGNOSIS — I10 Essential (primary) hypertension: Secondary | ICD-10-CM

## 2013-09-15 DIAGNOSIS — I739 Peripheral vascular disease, unspecified: Secondary | ICD-10-CM

## 2013-09-15 DIAGNOSIS — I2510 Atherosclerotic heart disease of native coronary artery without angina pectoris: Secondary | ICD-10-CM

## 2013-09-15 MED ORDER — ATORVASTATIN CALCIUM 20 MG PO TABS: 20.0000 mg | ORAL_TABLET | Freq: Every day | ORAL | Status: DC

## 2013-09-15 MED ORDER — METOPROLOL SUCCINATE ER 50 MG PO TB24: 50.0000 mg | ORAL_TABLET | Freq: Every day | ORAL | Status: DC

## 2013-09-15 MED ORDER — RAMIPRIL 10 MG PO CAPS: ORAL_CAPSULE | ORAL | Status: DC

## 2013-09-15 NOTE — Progress Notes (Signed)
HPI:  79 year old gentleman presenting for followup of coronary artery disease and hypertension. He underwent PCI of a lesion in his LAD back in 2007 with a drug-eluting stent platform. He had exertional dyspnea prior to this but has no history of angina. He stopped Plavix after he was seen last office visit in 2011.  The patient is doing well from a cardiac perspective. He noticed some discomfort in his right upper chest with breathing cold air when he went for a brisk walk this past year. He thinks is just related to the cold air and has had no other problems. He denies dyspnea, central chest pressure, orthopnea, or PND. He does have generalized fatigue. He continues to have hormone injections for treatment of prostate cancer. He denies palpitations, lightheadedness, or syncope.   Outpatient Encounter Prescriptions as of 09/15/2013  Medication Sig  . acetaminophen (TYLENOL) 500 MG tablet Take 500 mg by mouth every 6 (six) hours as needed.  Marland Kitchen aspirin 81 MG tablet Take 81 mg by mouth daily.  Marland Kitchen atorvastatin (LIPITOR) 20 MG tablet Take 1 tablet (20 mg total) by mouth daily.  . cetirizine-pseudoephedrine (ZYRTEC-D ALLERGY & CONGESTION) 5-120 MG per tablet Take 1 tablet by mouth as needed.  Marland Kitchen co-enzyme Q-10 30 MG capsule Take 30 mg by mouth daily.  . Cyanocobalamin (VITAMIN B-12 IJ) Inject as directed every 30 (thirty) days.  Marland Kitchen Epoetin Alfa (PROCRIT IJ) Inject as directed as needed.  Marland Kitchen glipiZIDE (GLUCOTROL) 10 MG tablet Take 10 mg by mouth 2 (two) times daily before a meal.  . linagliptin (TRADJENTA) 5 MG TABS tablet Take 2.5 mg by mouth daily.   . metFORMIN (GLUCOPHAGE) 1000 MG tablet Take 1,000 mg by mouth 2 (two) times daily with a meal.  . metoprolol succinate (TOPROL-XL) 50 MG 24 hr tablet TAKE ONE TABLET BY MOUTH EVERY DAY  . ramipril (ALTACE) 10 MG capsule TAKE ONE CAPSULE BY MOUTH EVERY DAY  . vitamin B-12 (CYANOCOBALAMIN) 1000 MCG tablet Take 1,000 mcg by mouth daily.  . [DISCONTINUED]  glipiZIDE (GLUCOTROL) 5 MG tablet Take 5 mg by mouth 2 (two) times daily before a meal.  . [DISCONTINUED] metFORMIN (GLUCOPHAGE) 500 MG tablet Take 500 mg by mouth 4 (four) times daily.   . [DISCONTINUED] metoprolol succinate (TOPROL XL) 50 MG 24 hr tablet Take 1 tablet (50 mg total) by mouth daily.    No Known Allergies  Past Medical History  Diagnosis Date  . CAD (coronary artery disease) 2007    pci with a drug-eluting stent 2007 to teeat severe lad stenosis  . HTN (hypertension)   . Hyperlipidemia   . Diabetes mellitus     ROS: Negative except as per HPI  BP 124/70  Pulse 78  Ht 5' 11.5" (1.816 m)  Wt 205 lb (92.987 kg)  BMI 28.20 kg/m2  PHYSICAL EXAM: Pt is alert and oriented, NAD HEENT: normal Neck: JVP - normal, carotids 2+= without bruits Lungs: CTA bilaterally CV: RRR without murmur or gallop Abd: soft, NT, Positive BS, no hepatomegaly Ext: no C/C/E, distal pulses intact and equal Skin: warm/dry no rash  EKG:  Normal sinus rhythm 78 beats per minute, age indeterminate inferior MI, otherwise within normal limits. No change from last years tracing.  ASSESSMENT AND PLAN: 1. Coronary artery disease, native vessel. Status post intracoronary stenting in 2007. He remained stable without symptoms of angina. He will continue on his current medical program which was reviewed today.  2. Hypertension. Blood pressure appears well-controlled on a  combination of metoprolol succinate and ramipril. Prescriptions written for his antihypertensive medications. I will see him back in one year.  3. Hyperlipidemia. The patient remains on atorvastatin. His prescription was updated. Will send for copies of his lab work from his primary care physician in [place].  For followup I will see him back in one year.  Sherren Mocha 09/15/2013 9:19 AM

## 2013-09-15 NOTE — Patient Instructions (Signed)
Your physician wants you to follow-up in: 1 year with Dr. Cooper.  You will receive a reminder letter in the mail two months in advance. If you don't receive a letter, please call our office to schedule the follow-up appointment.  Your physician recommends that you continue on your current medications as directed. Please refer to the Current Medication list given to you today.  

## 2013-10-13 ENCOUNTER — Telehealth: Payer: Self-pay

## 2013-10-13 DIAGNOSIS — I2510 Atherosclerotic heart disease of native coronary artery without angina pectoris: Secondary | ICD-10-CM

## 2013-10-13 NOTE — Telephone Encounter (Signed)
This is fine.-thx 

## 2013-10-13 NOTE — Telephone Encounter (Signed)
I called Mr Arriaga to clarify his ramipril, he states that he was taking 10 mg of ramipril twice a day. After viewing the chart with Theodosia Quay the patient was suppose to be taking only 1 tablet daily. I advise Mr Heinz to cut back down to 1 tab daily and to monitor his BP and call the office back with the BP readings. At the time he calls back with the BP  Readings and if it is elevated <140/90 then Dr Burt Knack will reevaluate his meds.  Mr Lafon called back and stated that Dr Gilford Rile In [facility] is the one who put him on 1 tablet twice a day. I told him that he would have to get the refill from Dr Thomes Dinning office.

## 2013-10-14 ENCOUNTER — Other Ambulatory Visit: Payer: Self-pay

## 2013-10-14 DIAGNOSIS — I2510 Atherosclerotic heart disease of native coronary artery without angina pectoris: Secondary | ICD-10-CM

## 2013-10-14 MED ORDER — RAMIPRIL 10 MG PO CAPS: ORAL_CAPSULE | ORAL | Status: DC

## 2014-08-17 ENCOUNTER — Encounter: Payer: Self-pay | Admitting: Cardiovascular Disease

## 2014-09-22 ENCOUNTER — Encounter: Payer: Self-pay | Admitting: Cardiovascular Disease

## 2014-09-23 ENCOUNTER — Encounter: Payer: Self-pay | Admitting: Cardiovascular Disease

## 2014-09-23 ENCOUNTER — Ambulatory Visit (INDEPENDENT_AMBULATORY_CARE_PROVIDER_SITE_OTHER): Payer: Medicare Other | Admitting: Cardiovascular Disease

## 2014-09-23 VITALS — BP 118/58 | HR 75 | Ht 71.0 in | Wt 208.8 lb

## 2014-09-23 DIAGNOSIS — I10 Essential (primary) hypertension: Secondary | ICD-10-CM

## 2014-09-23 DIAGNOSIS — E785 Hyperlipidemia, unspecified: Secondary | ICD-10-CM | POA: Diagnosis not present

## 2014-09-23 DIAGNOSIS — I2510 Atherosclerotic heart disease of native coronary artery without angina pectoris: Secondary | ICD-10-CM

## 2014-09-23 MED ORDER — RAMIPRIL 10 MG PO CAPS: ORAL_CAPSULE | ORAL | Status: DC

## 2014-09-23 MED ORDER — ATORVASTATIN CALCIUM 20 MG PO TABS: 20.0000 mg | ORAL_TABLET | Freq: Every day | ORAL | Status: DC

## 2014-09-23 NOTE — Progress Notes (Signed)
Cardiology Office Note   Date:  09/23/2014   ID:  TICO CROTTEAU, DOB April 30, 1935, MRN [id number]  PCP:  No primary care provider on file.  Cardiologist:  Sherren Mocha, MD    Chief Complaint  Patient presents with  . Coronary Artery Disease     History of Present Illness: Keith Church is a 80 y.o. male who presents for Follow-up of coronary artery disease.  He underwent PCI of a lesion in his LAD back in 2007 with a drug-eluting stent platform. He had exertional dyspnea prior to this but has no history of angina. He stopped Plavix after he was seen last office visit in 2011. He's also followed for hypertension, diabetes, and hyperlipidemia.  He continues to play 18 holes of golf on a regular basis without symptoms. Also walks 2 miles most days with his wife. He has adjusted medications for his diabetes recently. Otherwise reports no changes in his medical program. Denies chest pain, chest pressure, or shortness of breath. No edema, heart palpitations, or lightheadedness/presyncope.   Past Medical History  Diagnosis Date  . CAD (coronary artery disease) 2007    pci with a drug-eluting stent 2007 to teeat severe lad stenosis  . HTN (hypertension)   . Hyperlipidemia   . Diabetes mellitus     Past Surgical History  Procedure Laterality Date  . Pci      pci with a drug-eluting stent 2007 to treat severe lad stenosis  . Ptca      Current Outpatient Prescriptions  Medication Sig Dispense Refill  . aspirin 81 MG tablet Take 81 mg by mouth daily.    Marland Kitchen atorvastatin (LIPITOR) 20 MG tablet Take 1 tablet (20 mg total) by mouth daily. 90 tablet 3  . cetirizine-pseudoephedrine (ZYRTEC-D ALLERGY & CONGESTION) 5-120 MG per tablet Take 1 tablet by mouth as needed.    Marland Kitchen co-enzyme Q-10 30 MG capsule Take 30 mg by mouth daily.    . Cyanocobalamin (VITAMIN B-12 IJ) Inject as directed every 30 (thirty) days.    Marland Kitchen epoetin alfa (EPOGEN,PROCRIT) 02409 UNIT/ML injection Inject 10,000 Units into the  skin once. Patient taking subcutaneous injection once monthly as directed by his Cancer Doctor. Doesn't recall dosage.    Marland Kitchen glipiZIDE (GLUCOTROL) 10 MG tablet Take 10 mg by mouth 2 (two) times daily before a meal.    . LANTUS SOLOSTAR 100 UNIT/ML Solostar Pen Inject 16 Units into the skin daily.    Marland Kitchen linagliptin (TRADJENTA) 5 MG TABS tablet Take 2.5 mg by mouth daily.     . metFORMIN (GLUCOPHAGE) 1000 MG tablet Take 500 mg by mouth 2 (two) times daily with a meal.     . ramipril (ALTACE) 10 MG capsule TAKE ONE CAPSULE BY MOUTH TWICE A  DAY 180 capsule 3  . metoprolol succinate (TOPROL XL) 50 MG 24 hr tablet Take 1 tablet (50 mg total) by mouth daily. 90 tablet 3   No current facility-administered medications for this visit.    Allergies:   Review of patient's allergies indicates no known allergies.   Social History:  The patient  reports that he has never smoked. He does not have any smokeless tobacco history on file. He reports that he does not use illicit drugs.   Family History:  The patient's family history includes Coronary artery disease in an other family member; Heart disease in his mother; Stroke in his father.    ROS:  Please see the history of present illness.  All other  systems are reviewed and negative.   PHYSICAL EXAM: VS:  BP 118/58 mmHg  Pulse 75  Ht 5\' 11"  (1.803 m)  Wt 208 lb 12.8 oz (94.711 kg)  BMI 29.13 kg/m2 , BMI Body mass index is 29.13 kg/(m^2). GEN: Well nourished, well developed, in no acute distress HEENT: normal Neck: no JVD, no masses. No carotid bruits Cardiac: RRR without murmur or gallop                Respiratory:  clear to auscultation bilaterally, normal work of breathing GI: soft, nontender, nondistended, + BS MS: no deformity or atrophy Ext: no pretibial edema, pedal pulses 2+= bilaterally Skin: warm and dry, no rash Neuro:  Strength and sensation are intact Psych: euthymic mood, full affect  EKG:  EKG is ordered today. The ekg ordered  today shows normal sinus rhythm 75 bpm, occasional PVC, leftward axis.  Recent Labs: No results found for requested labs within last 365 days.   Lipid Panel  No results found for: CHOL, TRIG, HDL, CHOLHDL, VLDL, LDLCALC, LDLDIRECT    Wt Readings from Last 3 Encounters:  09/23/14 208 lb 12.8 oz (94.711 kg)  09/15/13 205 lb (92.987 kg)  09/12/12 206 lb 12.8 oz (93.804 kg)    Cardiac Studies Reviewed: 2-D echocardiogram 05/18/2014: This is reviewed from outside records. Left ventricular size and systolic function is normal with an LVEF of 55-60%. There is diastolic dysfunction. No significant valve dysfunction.  Pharmacologic nuclear perfusion study 04/14/2014: Normal LV cavity size with normal perfusion and no ischemia. LVEF 48%.  ASSESSMENT AND PLAN: 1.  CAD, native vessel, without symptoms of angina: recent nuclear scan reviewed and shows no ischemia. Medication program reviewed and includes aspirin, atorvastatin, and long-acting metoprolol. He is appropriately on an ACE inhibitor in the setting of his diabetes. I will see him back in one year.  2. Essential hypertension: blood pressure is well controlled on a combination of metoprolol succinate and ramipril.  3. Hyperlipidemia: patient is treated with atorvastatin. Recent labs were drawn by his primary care physician. These have been reviewed and are in the process of being scanned into the electronic health record.  4. Type 2 diabetes, managed with oral hypoglycemics and insulin. Reviewed the importance of weight loss and increased exercise extensively with the patient. Diabetes mellitus managed by his primary physician.  Current medicines are reviewed with the patient today.  The patient does not have concerns regarding medicines.  The following changes have been made:  no change  Labs/ tests ordered today include:   Orders Placed This Encounter  Procedures  . EKG 12-Lead   Disposition:   FU one year  Signed, Sherren Mocha, MD  09/23/2014 10:07 PM    [address], [place], [place]  [address] Phone: [phone]; Fax: [phone]

## 2014-09-23 NOTE — Patient Instructions (Signed)
Your physician wants you to follow-up in: 1 YEAR with Dr Cooper.  You will receive a reminder letter in the mail two months in advance. If you don't receive a letter, please call our office to schedule the follow-up appointment.  Your physician recommends that you continue on your current medications as directed. Please refer to the Current Medication list given to you today.  

## 2015-01-07 ENCOUNTER — Other Ambulatory Visit: Payer: Self-pay | Admitting: Cardiovascular Disease

## 2015-01-07 NOTE — Telephone Encounter (Signed)
Rx(s) sent to pharmacy electronically.  

## 2015-05-31 ENCOUNTER — Telehealth: Payer: Self-pay | Admitting: Cardiovascular Disease

## 2015-05-31 NOTE — Telephone Encounter (Signed)
New message     Pt saw a kidney doctor recently.  The kidney doctor want to cut back his ramipril to 5mg  daily.  Is this ok?

## 2015-05-31 NOTE — Telephone Encounter (Signed)
I spoke with the pt and his kidney doctor instructed him to decrease Ramipril to 5mg  daily, monitor BP and return for appointment in 2 weeks.  The pt is currently taking Ramipril 10mg  twice a day.  I advised him to follow instructions from Renal physician.  I also gave the pt our fax number so his results can be sent to our office.

## 2015-07-20 HISTORY — PX: CYSTOSCOPY W/ STONE MANIPULATION: SHX1427

## 2015-09-20 ENCOUNTER — Other Ambulatory Visit: Payer: Self-pay | Admitting: Cardiovascular Disease

## 2015-10-06 ENCOUNTER — Other Ambulatory Visit: Payer: Self-pay | Admitting: Cardiovascular Disease

## 2015-12-01 ENCOUNTER — Encounter: Payer: Self-pay | Admitting: Cardiovascular Disease

## 2015-12-01 ENCOUNTER — Ambulatory Visit (INDEPENDENT_AMBULATORY_CARE_PROVIDER_SITE_OTHER): Payer: Medicare Other | Admitting: Cardiovascular Disease

## 2015-12-01 VITALS — BP 140/68 | HR 80 | Ht 71.5 in | Wt 203.8 lb

## 2015-12-01 DIAGNOSIS — I25119 Atherosclerotic heart disease of native coronary artery with unspecified angina pectoris: Secondary | ICD-10-CM

## 2015-12-01 MED ORDER — ISOSORBIDE MONONITRATE ER 30 MG PO TB24: 30.0000 mg | ORAL_TABLET | Freq: Every day | ORAL | Status: DC

## 2015-12-01 MED ORDER — METOPROLOL SUCCINATE ER 50 MG PO TB24: 50.0000 mg | ORAL_TABLET | Freq: Every day | ORAL | Status: DC

## 2015-12-01 MED ORDER — ATORVASTATIN CALCIUM 20 MG PO TABS: 20.0000 mg | ORAL_TABLET | Freq: Every day | ORAL | Status: DC

## 2015-12-01 NOTE — Patient Instructions (Signed)
Medication Instructions:  Your physician has recommended you make the following change in your medication:  1. START Isosorbide MN 30mg  take one-half tablet by mouth daily for 1 WEEK and then INCREASE to one tablet by mouth daily  Labwork: No new orders.   Testing/Procedures: Your physician has requested that you have an exercise stress myoview. For further information please visit [email or web address]. Please follow instruction sheet, as given.  Follow-Up: Your physician wants you to follow-up in: 1 YEAR with Dr Burt Knack.  You will receive a reminder letter in the mail two months in advance. If you don't receive a letter, please call our office to schedule the follow-up appointment.   Any Other Special Instructions Will Be Listed Below (If Applicable).     If you need a refill on your cardiac medications before your next appointment, please call your pharmacy.

## 2015-12-01 NOTE — Progress Notes (Signed)
Cardiology Office Note Date:  12/01/2015   ID:  Keith Church, DOB 1934/12/24, MRN [id number]  PCP:  [facility]  Cardiologist:  Sherren Mocha, MD    Chief Complaint  Patient presents with  . Hypertension  . Coronary Artery Disease     History of Present Illness: Keith Church is a 81 y.o. male who presents for follow-up of coronary artery disease. He underwent PCI of a lesion in his LAD back in 2007 with a drug-eluting stent platform. He had exertional dyspnea prior to this but has no history of angina. Other conditions include HTN, diabetes, and hyperlipidemia.  The patient is here alone today. He lives in [place]. He has developed discomfort in his right upper chest with walking. He usually starts early on in his walking and he slows down. When he slows down the discomfort resolves. It is a pressure-like pain. There are no associated symptoms. He can continue on with walking as long as it is at a slower pace. He has not had any resting symptoms. He thinks the duration of the symptoms are approximately 6 months.  Past Medical History  Diagnosis Date  . CAD (coronary artery disease) 2007    pci with a drug-eluting stent 2007 to teeat severe lad stenosis  . HTN (hypertension)   . Hyperlipidemia   . Diabetes mellitus     Past Surgical History  Procedure Laterality Date  . Pci      pci with a drug-eluting stent 2007 to treat severe lad stenosis  . Ptca      Current Outpatient Prescriptions  Medication Sig Dispense Refill  . aspirin 81 MG tablet Take 81 mg by mouth daily.    . cetirizine-pseudoephedrine (ZYRTEC-D ALLERGY & CONGESTION) 5-120 MG per tablet Take 1 tablet by mouth daily as needed for allergies.     Marland Kitchen co-enzyme Q-10 30 MG capsule Take 30 mg by mouth daily.    . Cyanocobalamin (VITAMIN B-12 IJ) Inject as directed every 30 (thirty) days.    Marland Kitchen epoetin alfa (EPOGEN,PROCRIT) 96295 UNIT/ML injection Inject 10,000 Units into the skin once.  Patient taking subcutaneous injection once monthly as directed by his Cancer Doctor. Doesn't recall dosage.    . fluticasone (FLONASE) 50 MCG/ACT nasal spray Place 1-2 sprays into both nostrils daily as needed. Allergies    . glipiZIDE (GLUCOTROL) 10 MG tablet Take 10 mg by mouth 2 (two) times daily before a meal.    . hydrochlorothiazide (MICROZIDE) 12.5 MG capsule Take 12.5 mg by mouth daily.    Marland Kitchen LEVEMIR FLEXTOUCH 100 UNIT/ML Pen Inject 35 Units into the skin daily.    Marland Kitchen linagliptin (TRADJENTA) 5 MG TABS tablet Take 2.5 mg by mouth daily.     . Vitamin D, Ergocalciferol, (DRISDOL) 50000 units CAPS capsule Take 50,000 Units by mouth once a week.    Marland Kitchen atorvastatin (LIPITOR) 20 MG tablet Take 1 tablet (20 mg total) by mouth daily. 90 tablet 3  . isosorbide mononitrate (IMDUR) 30 MG 24 hr tablet Take 1 tablet (30 mg total) by mouth daily. 90 tablet 3  . metoprolol succinate (TOPROL-XL) 50 MG 24 hr tablet Take 1 tablet (50 mg total) by mouth daily. Take with or immediately following a meal. 90 tablet 3   No current facility-administered medications for this visit.    Allergies:   Review of patient's allergies indicates no known allergies.   Social History:  The patient  reports that he has never smoked.  He does not have any smokeless tobacco history on file. He reports that he does not use illicit drugs.   Family History:  The patient's family history includes Heart disease in his mother; Stroke in his father.   ROS:  Please see the history of present illness. All other systems are reviewed and negative.   PHYSICAL EXAM: VS:  BP 140/68 mmHg  Pulse 80  Ht 5' 11.5" (1.816 m)  Wt 203 lb 12.8 oz (92.443 kg)  BMI 28.03 kg/m2 , BMI Body mass index is 28.03 kg/(m^2). GEN: Well nourished, well developed, in no acute distress HEENT: normal Neck: no JVD, no masses. No carotid bruits Cardiac: RRR without murmur or gallop                Respiratory:  clear to auscultation bilaterally, normal work  of breathing GI: soft, nontender, nondistended, + BS MS: no deformity or atrophy Ext: no pretibial edema, pedal pulses 2+= bilaterally Skin: warm and dry, no rash Neuro:  Strength and sensation are intact Psych: euthymic mood, full affect  EKG:  EKG is ordered today. The ekg ordered today shows normal sinus rhythm 93 bpm, age indeterminate inferior infarct, occasional PVCs.  Recent Labs: No results found for requested labs within last 365 days.   Lipid Panel  No results found for: CHOL, TRIG, HDL, CHOLHDL, VLDL, LDLCALC, LDLDIRECT    Wt Readings from Last 3 Encounters:  12/01/15 203 lb 12.8 oz (92.443 kg)  09/23/14 208 lb 12.8 oz (94.711 kg)  09/15/13 205 lb (92.987 kg)    Cardiac Studies Reviewed: 2-D echocardiogram 05/18/2014: This is reviewed from outside records. Left ventricular size and systolic function is normal with an LVEF of 55-60%. There is diastolic dysfunction. No significant valve dysfunction.  Pharmacologic nuclear perfusion study 04/14/2014: Normal LV cavity size with normal perfusion and no ischemia. LVEF 48%.  ASSESSMENT AND PLAN: 1.  CAD, native vessel, with CCS Class II symptoms of angina: The patient has new onset angina with exertion since last year. His symptoms are fairly typical is a improved with rest. Will add isosorbide 30 mg daily (15 mg 1 week then increase to 30 mg as tolerated). Will check an exercise Myoview stress test for further risk stratification.  2. HTN with chronic kidney disease: Blood pressure is controlled on current medical therapy. The patient is followed by nephrologist. His ACE inhibitor and metformin were discontinued, I suspect because of progressive renal dysfunction.  3. Type 2 diabetes: Managed by his primary physician.  4. Hyperlipidemia: Treated with atorvastatin.  Current medicines are reviewed with the patient today.  The patient does not have concerns regarding medicines.  Labs/ tests ordered today include:    Orders Placed This Encounter  Procedures  . Myocardial Perfusion Imaging  . EKG 12-Lead    Disposition:   FU one year unless stress test is abnormal  Signed, Sherren Mocha, MD  12/01/2015 5:05 PM    [facility] [place], [place], [place]  [address] Phone: [phone]; Fax: [phone]

## 2015-12-26 ENCOUNTER — Telehealth (HOSPITAL_COMMUNITY): Payer: Self-pay | Admitting: *Deleted

## 2015-12-26 NOTE — Telephone Encounter (Signed)
Patient given detailed instructions per Myocardial Perfusion Study Information Sheet for the test on 12/29/15. Patient notified to arrive 15 minutes early and that it is imperative to arrive on time for appointment to keep from having the test rescheduled.  If you need to cancel or reschedule your appointment, please call the office within 24 hours of your appointment. Failure to do so may result in a cancellation of your appointment, and a $50 no show fee. Patient verbalized understanding. Hubbard Robinson, RN

## 2015-12-29 ENCOUNTER — Ambulatory Visit (HOSPITAL_COMMUNITY): Payer: Medicare Other | Attending: Internal Medicine

## 2015-12-29 ENCOUNTER — Encounter (HOSPITAL_COMMUNITY): Payer: Medicare Other

## 2015-12-29 DIAGNOSIS — E109 Type 1 diabetes mellitus without complications: Secondary | ICD-10-CM | POA: Insufficient documentation

## 2015-12-29 DIAGNOSIS — R9439 Abnormal result of other cardiovascular function study: Secondary | ICD-10-CM | POA: Insufficient documentation

## 2015-12-29 DIAGNOSIS — I10 Essential (primary) hypertension: Secondary | ICD-10-CM | POA: Insufficient documentation

## 2015-12-29 DIAGNOSIS — R0789 Other chest pain: Secondary | ICD-10-CM | POA: Insufficient documentation

## 2015-12-29 DIAGNOSIS — I25119 Atherosclerotic heart disease of native coronary artery with unspecified angina pectoris: Secondary | ICD-10-CM | POA: Diagnosis present

## 2015-12-29 LAB — MYOCARDIAL PERFUSION IMAGING
CHL CUP NUCLEAR SSS: 3
CSEPED: 4 min
CSEPEW: 4.6 METS
CSEPPHR: 171 {beats}/min
Exercise duration (sec): 50 s
LHR: 0.32
LV dias vol: 123 mL (ref 62–150)
LVSYSVOL: 89 mL
MPHR: 141 {beats}/min
NUC STRESS TID: 0.97
Percent HR: 121 %
Rest HR: 114 {beats}/min
SDS: 3
SRS: 0

## 2015-12-29 MED ORDER — TECHNETIUM TC 99M TETROFOSMIN IV KIT
11.0000 | PACK | Freq: Once | INTRAVENOUS | Status: AC | PRN
  Administered 2015-12-29: 11 via INTRAVENOUS
  Filled 2015-12-29: qty 11

## 2015-12-29 MED ORDER — TECHNETIUM TC 99M TETROFOSMIN IV KIT
32.6000 | PACK | Freq: Once | INTRAVENOUS | Status: AC | PRN
  Administered 2015-12-29: 33 via INTRAVENOUS
  Filled 2015-12-29: qty 33

## 2016-01-03 ENCOUNTER — Other Ambulatory Visit: Payer: Self-pay

## 2016-01-03 DIAGNOSIS — R9430 Abnormal result of cardiovascular function study, unspecified: Secondary | ICD-10-CM

## 2016-01-03 DIAGNOSIS — I25118 Atherosclerotic heart disease of native coronary artery with other forms of angina pectoris: Secondary | ICD-10-CM

## 2016-01-04 ENCOUNTER — Other Ambulatory Visit (INDEPENDENT_AMBULATORY_CARE_PROVIDER_SITE_OTHER): Payer: Medicare Other | Admitting: *Deleted

## 2016-01-04 ENCOUNTER — Encounter (INDEPENDENT_AMBULATORY_CARE_PROVIDER_SITE_OTHER): Payer: Self-pay

## 2016-01-04 DIAGNOSIS — R9430 Abnormal result of cardiovascular function study, unspecified: Secondary | ICD-10-CM | POA: Diagnosis not present

## 2016-01-04 DIAGNOSIS — I25118 Atherosclerotic heart disease of native coronary artery with other forms of angina pectoris: Secondary | ICD-10-CM | POA: Diagnosis not present

## 2016-01-04 LAB — CBC
HEMATOCRIT: 33.3 % — AB (ref 38.5–50.0)
HEMOGLOBIN: 11.4 g/dL — AB (ref 13.2–17.1)
MCH: 29.2 pg (ref 27.0–33.0)
MCHC: 34.2 g/dL (ref 32.0–36.0)
MCV: 85.2 fL (ref 80.0–100.0)
MPV: 10.2 fL (ref 7.5–12.5)
Platelets: 132 10*3/uL — ABNORMAL LOW (ref 140–400)
RBC: 3.91 MIL/uL — ABNORMAL LOW (ref 4.20–5.80)
RDW: 14.6 % (ref 11.0–15.0)
WBC: 6.1 10*3/uL (ref 3.8–10.8)

## 2016-01-04 LAB — PROTIME-INR
INR: 1
Prothrombin Time: 10.8 s (ref 9.0–11.5)

## 2016-01-04 LAB — BASIC METABOLIC PANEL
BUN: 29 mg/dL — ABNORMAL HIGH (ref 7–25)
CO2: 22 mmol/L (ref 20–31)
Calcium: 8.5 mg/dL — ABNORMAL LOW (ref 8.6–10.3)
Chloride: 108 mmol/L (ref 98–110)
Creat: 1.89 mg/dL — ABNORMAL HIGH (ref 0.70–1.18)
GLUCOSE: 263 mg/dL — AB (ref 65–99)
POTASSIUM: 4.3 mmol/L (ref 3.5–5.3)
SODIUM: 141 mmol/L (ref 135–146)

## 2016-01-05 ENCOUNTER — Encounter (HOSPITAL_COMMUNITY)
Admission: RE | Disposition: A | Discharge status: Home / Self Care | Payer: Self-pay | Source: Ambulatory Visit | Attending: Cardiovascular Disease

## 2016-01-05 ENCOUNTER — Encounter (HOSPITAL_COMMUNITY): Payer: Self-pay

## 2016-01-05 ENCOUNTER — Ambulatory Visit (HOSPITAL_COMMUNITY)
Admission: RE | Admit: 2016-01-05 | Discharge: 2016-01-06 | Disposition: A | Discharge status: Home / Self Care | Payer: Medicare Other | Source: Ambulatory Visit | Attending: Cardiovascular Disease | Admitting: Cardiovascular Disease

## 2016-01-05 ENCOUNTER — Other Ambulatory Visit: Payer: Self-pay

## 2016-01-05 DIAGNOSIS — T82858A Stenosis of vascular prosthetic devices, implants and grafts, initial encounter: Secondary | ICD-10-CM | POA: Diagnosis not present

## 2016-01-05 DIAGNOSIS — Z794 Long term (current) use of insulin: Secondary | ICD-10-CM | POA: Insufficient documentation

## 2016-01-05 DIAGNOSIS — I2584 Coronary atherosclerosis due to calcified coronary lesion: Secondary | ICD-10-CM | POA: Diagnosis not present

## 2016-01-05 DIAGNOSIS — N189 Chronic kidney disease, unspecified: Secondary | ICD-10-CM | POA: Diagnosis not present

## 2016-01-05 DIAGNOSIS — I2510 Atherosclerotic heart disease of native coronary artery without angina pectoris: Secondary | ICD-10-CM | POA: Insufficient documentation

## 2016-01-05 DIAGNOSIS — E785 Hyperlipidemia, unspecified: Secondary | ICD-10-CM | POA: Diagnosis not present

## 2016-01-05 DIAGNOSIS — Y838 Other surgical procedures as the cause of abnormal reaction of the patient, or of later complication, without mention of misadventure at the time of the procedure: Secondary | ICD-10-CM | POA: Diagnosis not present

## 2016-01-05 DIAGNOSIS — I130 Hypertensive heart and chronic kidney disease with heart failure and stage 1 through stage 4 chronic kidney disease, or unspecified chronic kidney disease: Secondary | ICD-10-CM | POA: Insufficient documentation

## 2016-01-05 DIAGNOSIS — I110 Hypertensive heart disease with heart failure: Secondary | ICD-10-CM | POA: Diagnosis present

## 2016-01-05 DIAGNOSIS — I5022 Chronic systolic (congestive) heart failure: Secondary | ICD-10-CM | POA: Diagnosis not present

## 2016-01-05 DIAGNOSIS — Z7982 Long term (current) use of aspirin: Secondary | ICD-10-CM | POA: Diagnosis not present

## 2016-01-05 DIAGNOSIS — Z955 Presence of coronary angioplasty implant and graft: Secondary | ICD-10-CM

## 2016-01-05 DIAGNOSIS — R9439 Abnormal result of other cardiovascular function study: Secondary | ICD-10-CM | POA: Diagnosis present

## 2016-01-05 HISTORY — DX: Chronic kidney disease, stage 3 unspecified: N18.30

## 2016-01-05 HISTORY — DX: Type 2 diabetes mellitus without complications: E11.9

## 2016-01-05 HISTORY — DX: Chronic kidney disease, stage 3 (moderate): N18.3

## 2016-01-05 HISTORY — DX: Malignant neoplasm of prostate: C61

## 2016-01-05 HISTORY — DX: Calculus of kidney: N20.0

## 2016-01-05 HISTORY — PX: CARDIAC CATHETERIZATION: SHX172

## 2016-01-05 HISTORY — PX: CORONARY ANGIOPLASTY WITH STENT PLACEMENT: SHX49

## 2016-01-05 LAB — GLUCOSE, CAPILLARY
GLUCOSE-CAPILLARY: 73 mg/dL (ref 65–99)
GLUCOSE-CAPILLARY: 136 mg/dL — AB (ref 65–99)
Glucose-Capillary: 151 mg/dL — ABNORMAL HIGH (ref 65–99)
Glucose-Capillary: 86 mg/dL (ref 65–99)

## 2016-01-05 LAB — POCT ACTIVATED CLOTTING TIME
Activated Clotting Time: 241 seconds
Activated Clotting Time: 252 seconds

## 2016-01-05 SURGERY — LEFT HEART CATH AND CORONARY ANGIOGRAPHY

## 2016-01-05 MED ORDER — HEPARIN SODIUM (PORCINE) 1000 UNIT/ML IJ SOLN
INTRAMUSCULAR | Status: AC
  Filled 2016-01-05: qty 1

## 2016-01-05 MED ORDER — NITROGLYCERIN 1 MG/10 ML FOR IR/CATH LAB
INTRA_ARTERIAL | Status: DC | PRN
  Administered 2016-01-05: 200 ug via INTRACORONARY

## 2016-01-05 MED ORDER — SODIUM CHLORIDE 0.9% FLUSH: 3.0000 mL | Freq: Two times a day (BID) | INTRAVENOUS | Status: DC

## 2016-01-05 MED ORDER — SODIUM CHLORIDE 0.9 % WEIGHT BASED INFUSION: 3.0000 mL/kg/h | INTRAVENOUS | Status: AC

## 2016-01-05 MED ORDER — TICAGRELOR 90 MG PO TABS
ORAL_TABLET | ORAL | Status: DC | PRN
  Administered 2016-01-05: 180 mg via ORAL

## 2016-01-05 MED ORDER — ACETAMINOPHEN 325 MG PO TABS: 650.0000 mg | ORAL_TABLET | Freq: Every day | ORAL | Status: DC | PRN

## 2016-01-05 MED ORDER — SODIUM CHLORIDE 0.9 % IV SOLN: 250.0000 mL | INTRAVENOUS | Status: DC | PRN

## 2016-01-05 MED ORDER — OXYCODONE-ACETAMINOPHEN 5-325 MG PO TABS: 1.0000 | ORAL_TABLET | ORAL | Status: DC | PRN

## 2016-01-05 MED ORDER — METOPROLOL SUCCINATE ER 50 MG PO TB24
50.0000 mg | ORAL_TABLET | Freq: Every day | ORAL | Status: DC
  Administered 2016-01-05 – 2016-01-06 (×2): 50 mg via ORAL
  Filled 2016-01-05 (×2): qty 1

## 2016-01-05 MED ORDER — NITROGLYCERIN 1 MG/10 ML FOR IR/CATH LAB
INTRA_ARTERIAL | Status: AC
  Filled 2016-01-05: qty 10

## 2016-01-05 MED ORDER — FENTANYL CITRATE (PF) 100 MCG/2ML IJ SOLN
INTRAMUSCULAR | Status: DC | PRN
  Administered 2016-01-05: 25 ug via INTRAVENOUS

## 2016-01-05 MED ORDER — ASPIRIN 81 MG PO CHEW: 81.0000 mg | CHEWABLE_TABLET | ORAL | Status: DC

## 2016-01-05 MED ORDER — VERAPAMIL HCL 2.5 MG/ML IV SOLN
INTRAVENOUS | Status: AC
  Filled 2016-01-05: qty 2

## 2016-01-05 MED ORDER — TICAGRELOR 90 MG PO TABS
90.0000 mg | ORAL_TABLET | Freq: Two times a day (BID) | ORAL | Status: DC
  Administered 2016-01-06: 11:00:00 90 mg via ORAL
  Filled 2016-01-05: qty 1

## 2016-01-05 MED ORDER — VERAPAMIL HCL 2.5 MG/ML IV SOLN
INTRA_ARTERIAL | Status: DC | PRN
  Administered 2016-01-05: 10 mL via INTRA_ARTERIAL

## 2016-01-05 MED ORDER — IOPAMIDOL (ISOVUE-370) INJECTION 76%
INTRAVENOUS | Status: AC
  Filled 2016-01-05: qty 50

## 2016-01-05 MED ORDER — MIDAZOLAM HCL 2 MG/2ML IJ SOLN
INTRAMUSCULAR | Status: DC | PRN
  Administered 2016-01-05: 2 mg via INTRAVENOUS

## 2016-01-05 MED ORDER — IOPAMIDOL (ISOVUE-370) INJECTION 76%
INTRAVENOUS | Status: DC | PRN
  Administered 2016-01-05: 110 mL via INTRA_ARTERIAL

## 2016-01-05 MED ORDER — INSULIN DETEMIR 100 UNIT/ML ~~LOC~~ SOLN
35.0000 [IU] | Freq: Every day | SUBCUTANEOUS | Status: DC
  Administered 2016-01-05: 22:00:00 35 [IU] via SUBCUTANEOUS
  Filled 2016-01-05 (×2): qty 0.35

## 2016-01-05 MED ORDER — LINAGLIPTIN 5 MG PO TABS
2.5000 mg | ORAL_TABLET | Freq: Every day | ORAL | Status: DC
  Administered 2016-01-06: 2.5 mg via ORAL
  Filled 2016-01-05: qty 1

## 2016-01-05 MED ORDER — SODIUM CHLORIDE 0.9% FLUSH: 3.0000 mL | INTRAVENOUS | Status: DC | PRN

## 2016-01-05 MED ORDER — MIDAZOLAM HCL 2 MG/2ML IJ SOLN
INTRAMUSCULAR | Status: AC
  Filled 2016-01-05: qty 2

## 2016-01-05 MED ORDER — HEPARIN SODIUM (PORCINE) 1000 UNIT/ML IJ SOLN
INTRAMUSCULAR | Status: DC | PRN
  Administered 2016-01-05: 5000 [IU] via INTRAVENOUS
  Administered 2016-01-05: 4000 [IU] via INTRAVENOUS
  Administered 2016-01-05: 3000 [IU] via INTRAVENOUS

## 2016-01-05 MED ORDER — INSULIN DETEMIR 100 UNIT/ML FLEXPEN
35.0000 [IU] | PEN_INJECTOR | Freq: Every day | SUBCUTANEOUS | Status: DC
Start: 2016-01-05 — End: 2016-01-05

## 2016-01-05 MED ORDER — ATORVASTATIN CALCIUM 20 MG PO TABS: 20.0000 mg | ORAL_TABLET | Freq: Every day | ORAL | Status: DC

## 2016-01-05 MED ORDER — SODIUM CHLORIDE 0.9 % WEIGHT BASED INFUSION: 1.0000 mL/kg/h | INTRAVENOUS | Status: DC

## 2016-01-05 MED ORDER — HEPARIN (PORCINE) IN NACL 2-0.9 UNIT/ML-% IJ SOLN
INTRAMUSCULAR | Status: DC | PRN
  Administered 2016-01-05: 1000 mL

## 2016-01-05 MED ORDER — SODIUM CHLORIDE 0.9 % WEIGHT BASED INFUSION
3.0000 mL/kg/h | INTRAVENOUS | Status: DC
  Administered 2016-01-05: 3 mL/kg/h via INTRAVENOUS

## 2016-01-05 MED ORDER — GLIPIZIDE 5 MG PO TABS
10.0000 mg | ORAL_TABLET | Freq: Two times a day (BID) | ORAL | Status: DC
  Administered 2016-01-06: 10 mg via ORAL
  Filled 2016-01-05: qty 2

## 2016-01-05 MED ORDER — TICAGRELOR 90 MG PO TABS
ORAL_TABLET | ORAL | Status: AC
  Filled 2016-01-05: qty 2

## 2016-01-05 MED ORDER — LIDOCAINE HCL (PF) 1 % IJ SOLN
INTRAMUSCULAR | Status: AC
  Filled 2016-01-05: qty 30

## 2016-01-05 MED ORDER — ASPIRIN EC 81 MG PO TBEC
81.0000 mg | DELAYED_RELEASE_TABLET | Freq: Every evening | ORAL | Status: DC
Start: 2016-01-06 — End: 2016-01-06

## 2016-01-05 MED ORDER — FLUTICASONE PROPIONATE 50 MCG/ACT NA SUSP
1.0000 | Freq: Every day | NASAL | Status: DC | PRN
  Filled 2016-01-05: qty 16

## 2016-01-05 MED ORDER — FENTANYL CITRATE (PF) 100 MCG/2ML IJ SOLN
INTRAMUSCULAR | Status: AC
  Filled 2016-01-05: qty 2

## 2016-01-05 MED ORDER — IOPAMIDOL (ISOVUE-370) INJECTION 76%
INTRAVENOUS | Status: AC
  Filled 2016-01-05: qty 100

## 2016-01-05 MED ORDER — ISOSORBIDE MONONITRATE ER 30 MG PO TB24
30.0000 mg | ORAL_TABLET | Freq: Every day | ORAL | Status: DC
  Administered 2016-01-05 – 2016-01-06 (×2): 30 mg via ORAL
  Filled 2016-01-05 (×2): qty 1

## 2016-01-05 MED ORDER — LABETALOL HCL 5 MG/ML IV SOLN: 20.0000 mg | INTRAVENOUS | Status: DC | PRN

## 2016-01-05 MED ORDER — HEPARIN (PORCINE) IN NACL 2-0.9 UNIT/ML-% IJ SOLN
INTRAMUSCULAR | Status: AC
  Filled 2016-01-05: qty 1000

## 2016-01-05 MED ORDER — LIDOCAINE HCL (PF) 1 % IJ SOLN
INTRAMUSCULAR | Status: DC | PRN
  Administered 2016-01-05: 2 mL

## 2016-01-05 MED ORDER — ONDANSETRON HCL 4 MG/2ML IJ SOLN: 4.0000 mg | Freq: Four times a day (QID) | INTRAMUSCULAR | Status: DC | PRN

## 2016-01-05 SURGICAL SUPPLY — 18 items
BALLN EMERGE MR 3.0X15 (BALLOONS) ×3
BALLN ~~LOC~~ TREK RX 4.0X8 (BALLOONS) ×3
BALLOON EMERGE MR 3.0X15 (BALLOONS) ×1 IMPLANT
BALLOON ~~LOC~~ TREK RX 4.0X8 (BALLOONS) ×1 IMPLANT
CATH INFINITI 5 FR JL3.5 (CATHETERS) ×3 IMPLANT
CATH INFINITI JR4 5F (CATHETERS) ×3 IMPLANT
CATH VISTA GUIDE 6FR XBLAD3.5 (CATHETERS) ×3 IMPLANT
DEVICE RAD COMP TR BAND LRG (VASCULAR PRODUCTS) ×3 IMPLANT
GLIDESHEATH SLEND SS 6F .021 (SHEATH) ×3 IMPLANT
KIT ENCORE 26 ADVANTAGE (KITS) ×3 IMPLANT
KIT HEART LEFT (KITS) ×3 IMPLANT
PACK CARDIAC CATHETERIZATION (CUSTOM PROCEDURE TRAY) ×3 IMPLANT
STENT PROMUS PREM MR 3.5X12 (Permanent Stent) ×3 IMPLANT
SYR MEDRAD MARK V 150ML (SYRINGE) ×3 IMPLANT
TRANSDUCER W/STOPCOCK (MISCELLANEOUS) ×3 IMPLANT
TUBING CIL FLEX 10 FLL-RA (TUBING) ×3 IMPLANT
WIRE COUGAR XT STRL 190CM (WIRE) ×3 IMPLANT
WIRE SAFE-T 1.5MM-J .035X260CM (WIRE) ×3 IMPLANT

## 2016-01-05 NOTE — Progress Notes (Signed)
Pt r radial site stable, bp elevated, Dr Burt Knack visited pt, confered with Dr. Burt Knack about elevated BP, instructed to give evening dose of Metoprolol and order 20mg  Labetalol IV Q 2H PRN SBP > 160.  Will continue to monitor pt closely.

## 2016-01-05 NOTE — Research (Signed)
LEADERS FREE II Informed Consent   Subject Name: Keith Church  Subject met inclusion and exclusion criteria.  The informed consent form, study requirements and expectations were reviewed with the subject and questions and concerns were addressed prior to the signing of the consent form.  The subject verbalized understanding of the trail requirements.  The subject agreed to participate in the LEADERS FREE II trial and signed the informed consent.  The informed consent was obtained prior to performance of any protocol-specific procedures for the subject.  A copy of the signed informed consent was given to the subject and a copy was placed in the subject's medical record.  Hedrick,Steven Veazie W 01/05/2016, 12:58 PM

## 2016-01-05 NOTE — Progress Notes (Signed)
Pt arrived to floor with R radial site with level 2 hematoma with coban in place, bruising and swelling noted to area.  R hand cool, slightly purple with cap refill of 4 seconds.  L hand capillary refill of 4 seconds.  Removed coban and placed a pressure dressing of small roll of kerlex to R radial with coban.  Held pressure over koban for 10 minutes while expressing hematoma.  Color returned to hand, cap refill still 4 seconds.  Site stable, will continue to monitor closely.

## 2016-01-05 NOTE — H&P (Signed)
Keith Mocha, MD at 12/01/2015 1:28 PM     Status: Signed       Expand All Collapse All      Cardiology Office Note Date: 12/01/2015   ID: Keith Church, DOB [birth year], MRN [id number]  PCP: [facility] Cardiologist: Keith Mocha, MD   Chief Complaint  Patient presents with  . Hypertension  . Coronary Artery Disease    History of Present Illness: Keith Church is a 81 y.o. male who presents for follow-up of coronary artery disease. He underwent PCI of a lesion in his LAD back in 2007 with a drug-eluting stent platform. He had exertional dyspnea prior to this but has no history of angina. Other conditions include HTN, diabetes, and hyperlipidemia.  The patient is here alone today. He lives in [place]. He has developed discomfort in his right upper chest with walking. He usually starts early on in his walking and he slows down. When he slows down the discomfort resolves. It is a pressure-like pain. There are no associated symptoms. He can continue on with walking as long as it is at a slower pace. He has not had any resting symptoms. He thinks the duration of the symptoms are approximately 6 months.  Past Medical History  Diagnosis Date  . CAD (coronary artery disease) 2007    pci with a drug-eluting stent 2007 to teeat severe lad stenosis  . HTN (hypertension)   . Hyperlipidemia   . Diabetes mellitus     Past Surgical History  Procedure Laterality Date  . Pci      pci with a drug-eluting stent 2007 to treat severe lad stenosis  . Ptca      Current Outpatient Prescriptions  Medication Sig Dispense Refill  . aspirin 81 MG tablet Take 81 mg by mouth daily.    . cetirizine-pseudoephedrine (ZYRTEC-D ALLERGY & CONGESTION) 5-120 MG per tablet Take 1 tablet by mouth daily as needed for allergies.     Marland Kitchen co-enzyme Q-10 30 MG capsule Take 30 mg by mouth daily.    .  Cyanocobalamin (VITAMIN B-12 IJ) Inject as directed every 30 (thirty) days.    Marland Kitchen epoetin alfa (EPOGEN,PROCRIT) 60454 UNIT/ML injection Inject 10,000 Units into the skin once. Patient taking subcutaneous injection once monthly as directed by his Cancer Doctor. Doesn't recall dosage.    . fluticasone (FLONASE) 50 MCG/ACT nasal spray Place 1-2 sprays into both nostrils daily as needed. Allergies    . glipiZIDE (GLUCOTROL) 10 MG tablet Take 10 mg by mouth 2 (two) times daily before a meal.    . hydrochlorothiazide (MICROZIDE) 12.5 MG capsule Take 12.5 mg by mouth daily.    Marland Kitchen LEVEMIR FLEXTOUCH 100 UNIT/ML Pen Inject 35 Units into the skin daily.    Marland Kitchen linagliptin (TRADJENTA) 5 MG TABS tablet Take 2.5 mg by mouth daily.     . Vitamin D, Ergocalciferol, (DRISDOL) 50000 units CAPS capsule Take 50,000 Units by mouth once a week.    Marland Kitchen atorvastatin (LIPITOR) 20 MG tablet Take 1 tablet (20 mg total) by mouth daily. 90 tablet 3  . isosorbide mononitrate (IMDUR) 30 MG 24 hr tablet Take 1 tablet (30 mg total) by mouth daily. 90 tablet 3  . metoprolol succinate (TOPROL-XL) 50 MG 24 hr tablet Take 1 tablet (50 mg total) by mouth daily. Take with or immediately following a meal. 90 tablet 3   No current facility-administered medications for this visit.    Allergies: Review of patient's allergies  indicates no known allergies.   Social History: The patient  reports that he has never smoked. He does not have any smokeless tobacco history on file. He reports that he does not use illicit drugs.   Family History: The patient's family history includes Heart disease in his mother; Stroke in his father.   ROS: Please see the history of present illness. All other systems are reviewed and negative.   PHYSICAL EXAM: VS: BP 140/68 mmHg  Pulse 80  Ht 5' 11.5" (1.816 m)  Wt 203 lb 12.8 oz (92.443 kg)  BMI 28.03 kg/m2 , BMI Body mass index is 28.03  kg/(m^2). GEN: Well nourished, well developed, in no acute distress  HEENT: normal  Neck: no JVD, no masses. No carotid bruits Cardiac: RRR without murmur or gallop  Respiratory: clear to auscultation bilaterally, normal work of breathing GI: soft, nontender, nondistended, + BS MS: no deformity or atrophy  Ext: no pretibial edema, pedal pulses 2+= bilaterally Skin: warm and dry, no rash Neuro: Strength and sensation are intact Psych: euthymic mood, full affect  EKG: EKG is ordered today. The ekg ordered today shows normal sinus rhythm 93 bpm, age indeterminate inferior infarct, occasional PVCs.  Recent Labs: No results found for requested labs within last 365 days.   Lipid Panel   Labs (Brief)    No results found for: CHOL, TRIG, HDL, CHOLHDL, VLDL, LDLCALC, LDLDIRECT     Wt Readings from Last 3 Encounters:  12/01/15 203 lb 12.8 oz (92.443 kg)  09/23/14 208 lb 12.8 oz (94.711 kg)  09/15/13 205 lb (92.987 kg)    Cardiac Studies Reviewed: 2-D echocardiogram 05/18/2014: This is reviewed from outside records. Left ventricular size and systolic function is normal with an LVEF of 55-60%. There is diastolic dysfunction. No significant valve dysfunction.  Pharmacologic nuclear perfusion study 04/14/2014: Normal LV cavity size with normal perfusion and no ischemia. LVEF 48%.  ASSESSMENT AND PLAN: 1. CAD, native vessel, with CCS Class II symptoms of angina: The patient has new onset angina with exertion since last year. His symptoms are fairly typical is a improved with rest. Will add isosorbide 30 mg daily (15 mg 1 week then increase to 30 mg as tolerated). Will check an exercise Myoview stress test for further risk stratification.  2. HTN with chronic kidney disease: Blood pressure is controlled on current medical therapy. The patient is followed by nephrologist. His ACE inhibitor and metformin were discontinued, I suspect because of progressive renal  dysfunction.  3. Type 2 diabetes: Managed by his primary physician.  4. Hyperlipidemia: Treated with atorvastatin.  Current medicines are reviewed with the patient today. The patient does not have concerns regarding medicines.  Labs/ tests ordered today include:  Orders Placed This Encounter  Procedures  . Myocardial Perfusion Imaging  . EKG 12-Lead    Disposition: FU one year unless stress test is abnormal       ADDENDUM: The patient underwent an exercise stress Myoview scan. The result is high risk with severe LV dysfunction, multi-territory ischemia, chest pain with exertion, nonsustained ventricular tachycardia with exercise, and significant ST segment depression. He now presents for cardiac catheterization and possible PCI. The patient has chronic kidney disease and has been prehydrated aggressively. Further disposition pending his cardiac catheterization result.  Keith Church 01/05/2016 5:00 PM

## 2016-01-05 NOTE — Progress Notes (Signed)
Small area of bruising just proximal to band.Slight ridge reappears, will wrap rt forearm w/coband.

## 2016-01-06 ENCOUNTER — Encounter (HOSPITAL_COMMUNITY): Payer: Self-pay | Admitting: Cardiovascular Disease

## 2016-01-06 ENCOUNTER — Other Ambulatory Visit (HOSPITAL_COMMUNITY): Payer: Medicare Other

## 2016-01-06 ENCOUNTER — Telehealth: Payer: Self-pay | Admitting: Cardiovascular Disease

## 2016-01-06 ENCOUNTER — Ambulatory Visit (HOSPITAL_BASED_OUTPATIENT_CLINIC_OR_DEPARTMENT_OTHER): Payer: Medicare Other

## 2016-01-06 DIAGNOSIS — I5022 Chronic systolic (congestive) heart failure: Secondary | ICD-10-CM | POA: Diagnosis not present

## 2016-01-06 DIAGNOSIS — I10 Essential (primary) hypertension: Secondary | ICD-10-CM

## 2016-01-06 DIAGNOSIS — I2510 Atherosclerotic heart disease of native coronary artery without angina pectoris: Secondary | ICD-10-CM | POA: Diagnosis not present

## 2016-01-06 DIAGNOSIS — R931 Abnormal findings on diagnostic imaging of heart and coronary circulation: Secondary | ICD-10-CM | POA: Diagnosis not present

## 2016-01-06 DIAGNOSIS — I255 Ischemic cardiomyopathy: Secondary | ICD-10-CM

## 2016-01-06 DIAGNOSIS — I25118 Atherosclerotic heart disease of native coronary artery with other forms of angina pectoris: Secondary | ICD-10-CM

## 2016-01-06 DIAGNOSIS — I2584 Coronary atherosclerosis due to calcified coronary lesion: Secondary | ICD-10-CM | POA: Diagnosis not present

## 2016-01-06 DIAGNOSIS — T82858A Stenosis of vascular prosthetic devices, implants and grafts, initial encounter: Secondary | ICD-10-CM | POA: Diagnosis not present

## 2016-01-06 LAB — CBC
HEMATOCRIT: 32 % — AB (ref 39.0–52.0)
HEMOGLOBIN: 11 g/dL — AB (ref 13.0–17.0)
MCH: 29.1 pg (ref 26.0–34.0)
MCHC: 34.4 g/dL (ref 30.0–36.0)
MCV: 84.7 fL (ref 78.0–100.0)
Platelets: 134 10*3/uL — ABNORMAL LOW (ref 150–400)
RBC: 3.78 MIL/uL — ABNORMAL LOW (ref 4.22–5.81)
RDW: 13.8 % (ref 11.5–15.5)
WBC: 6.5 10*3/uL (ref 4.0–10.5)

## 2016-01-06 LAB — BASIC METABOLIC PANEL
ANION GAP: 7 (ref 5–15)
BUN: 24 mg/dL — AB (ref 6–20)
CHLORIDE: 112 mmol/L — AB (ref 101–111)
CO2: 21 mmol/L — ABNORMAL LOW (ref 22–32)
Calcium: 8.7 mg/dL — ABNORMAL LOW (ref 8.9–10.3)
Creatinine, Ser: 1.82 mg/dL — ABNORMAL HIGH (ref 0.61–1.24)
GFR calc Af Amer: 39 mL/min — ABNORMAL LOW (ref >60)
GFR, EST NON AFRICAN AMERICAN: 34 mL/min — AB (ref >60)
Glucose, Bld: 193 mg/dL — ABNORMAL HIGH (ref 65–99)
POTASSIUM: 4.5 mmol/L (ref 3.5–5.1)
SODIUM: 140 mmol/L (ref 135–145)

## 2016-01-06 LAB — ECHOCARDIOGRAM COMPLETE
HEIGHTINCHES: 71.5 in
Weight: 3174.62 oz

## 2016-01-06 LAB — GLUCOSE, CAPILLARY
GLUCOSE-CAPILLARY: 192 mg/dL — AB (ref 65–99)
GLUCOSE-CAPILLARY: 166 mg/dL — AB (ref 65–99)

## 2016-01-06 MED ORDER — PERFLUTREN LIPID MICROSPHERE
1.0000 mL | INTRAVENOUS | Status: AC | PRN
  Administered 2016-01-06: 14:00:00 2 mL via INTRAVENOUS
  Filled 2016-01-06: qty 10

## 2016-01-06 MED ORDER — LIVING BETTER WITH HEART FAILURE BOOK: Freq: Once | Status: DC

## 2016-01-06 MED ORDER — NITROGLYCERIN 0.4 MG SL SUBL: 0.4000 mg | SUBLINGUAL_TABLET | SUBLINGUAL | Status: AC | PRN

## 2016-01-06 MED ORDER — PERFLUTREN LIPID MICROSPHERE
INTRAVENOUS | Status: AC
  Filled 2016-01-06: qty 10

## 2016-01-06 MED ORDER — ANGIOPLASTY BOOK
Freq: Once | Status: DC
Start: 2016-01-06 — End: 2016-01-06
  Filled 2016-01-06: qty 1

## 2016-01-06 MED ORDER — INSULIN DETEMIR 100 UNIT/ML ~~LOC~~ SOLN: 35.0000 [IU] | Freq: Every day | SUBCUTANEOUS | Status: DC

## 2016-01-06 NOTE — Discharge Instructions (Addendum)
° °  TAKE TWILIGHT STUDY DRUGS AS DIRECTED.  STOP TAKING NAPROXEN, AND ALL NSAIDS (MOTRIN, IBUPROFEN, ALEVE, ADVIL).

## 2016-01-06 NOTE — Progress Notes (Signed)
Echocardiogram 2D Echocardiogram with Definity has been performed.  Tresa Res 01/06/2016, 2:05 PM

## 2016-01-06 NOTE — Discharge Summary (Signed)
Discharge Summary    Patient ID: Keith Church,  MRN: [id number], DOB/AGE: 1934/12/10 81 y.o.  Admit date: 01/05/2016 Discharge date: 01/06/2016  Primary Care Provider: [facility] Primary Cardiologist: Dr. Burt Knack  Discharge Diagnoses    Active Problems:   CAD, NATIVE VESSEL   HYPERTENSION, BENIGN   Abnormal nuclear stress test   Chronic systolic CHF (congestive heart failure) (HCC)   Allergies No Known Allergies  Diagnostic Studies/Procedures    LHC: 01/05/2016  Conclusion     Mid RCA lesion, 40% stenosed.  Dist Cx lesion, 90% stenosed.  Mid LAD lesion, 30% stenosed. The lesion was previously treated with a stent (unknown type).  Prox LAD lesion, 95% stenosed. Post intervention, there is a 0% residual stenosis.  1. Severe stenosis of the proximal LAD, treated successfully with PCI using a 3.5 x 12 mm Promus DES 2. Continued patency of the stented segment in the mid LAD with mild diffuse in-stent restenosis 3. Heavily calcified coronary arteries with nonobstructive stenosis in the major portions of the RCA and left circumflex and severe stenosis of the distal left circumflex extending into to the PLA branch  Recommendations: Dual antiplatelet therapy with aspirin and brilinta for 12 months or consideration of the Twilight clinical trial. Ongoing risk reduction efforts.   Diagnostic Diagram           Post-Intervention Diagram          _____________   History of Present Illness     Mr. [name] is a 81 yo male with PMH of CAD s/p DES to LAD 2007/HTN/HL and DM II who presented to Dr. York Cerise office for follow up. At that time he reported having discomfort to the upper chest with walking. Reports he lives at [facility]. The discomfort comes when he is walking his usual routine in the morning. Reported the symptoms had been present for the past 6 months. Underwent a [employer] that was high risk with severe LV dysfunction,  multi-territory ischemia, chest pain with exertion, NSVT and significant ST depression. He was taken for [facility] with Dr. Burt Knack on 01/05/2016.   Hospital Course     Consultants: None  Mr. Jaquish LHC showed showed severe stenosis of the proximal LAD that was successfully treated with PCI and DES. Continued patency of previously stented segment of mid LAD, and calcified coronaries with nonobstructive stenosis to major portions of RCA and left Crx. Plan to continue with dual antiplatelet therapy (ASA, Brilinta) for a year. Plans to participate in the Twilight study.   On 01/06/2016 he was seen and examined by Dr. Martinique and determined stable for discharge. Labs and cath site stable post cath. Was able to ambulate with cardiac rehab without any angina or dyspnea. He is pending a 2D echo prior to discharge, with results to follow up at [facility] appt. Agrees to come in next week from [facility] for [facility] appt follow up. He has been enrolled in the [place] study so this Rx for Brilinta was not sent the pharmacy but given through the research trial.  _____________  Discharge Vitals Blood pressure 157/81, pulse 70, temperature 98.2 F (36.8 C), temperature source Oral, resp. rate 17, height 5' 11.5" (1.816 m), weight 198 lb 6.6 oz (90 kg), SpO2 99 %.  Filed Weights   01/05/16 1157 01/06/16 0451  Weight: 201 lb (91.173 kg) 198 lb 6.6 oz (90 kg)    Labs & Radiologic Studies    CBC  Recent Labs  01/04/16 1103 01/06/16 0616  WBC 6.1 6.5  HGB 11.4* 11.0*  HCT 33.3* 32.0*  MCV 85.2 84.7  PLT 132* Q000111Q*   Basic Metabolic Panel  Recent Labs  01/04/16 1103 01/06/16 0616  NA 141 140  K 4.3 4.5  CL 108 112*  CO2 22 21*  GLUCOSE 263* 193*  BUN 29* 24*  CREATININE 1.89* 1.82*  CALCIUM 8.5* 8.7*   Liver Function Tests No results for input(s): AST, ALT, ALKPHOS, BILITOT, PROT, ALBUMIN in the last 72 hours. No results for input(s): LIPASE, AMYLASE in the last 72 hours. Cardiac Enzymes No results for  input(s): CKTOTAL, CKMB, CKMBINDEX, TROPONINI in the last 72 hours. BNP Invalid input(s): POCBNP D-Dimer No results for input(s): DDIMER in the last 72 hours. Hemoglobin A1C No results for input(s): HGBA1C in the last 72 hours. Fasting Lipid Panel No results for input(s): CHOL, HDL, LDLCALC, TRIG, CHOLHDL, LDLDIRECT in the last 72 hours. Thyroid Function Tests No results for input(s): TSH, T4TOTAL, T3FREE, THYROIDAB in the last 72 hours.  Invalid input(s): FREET3 _____________  No results found. Disposition   Pt is being discharged home today in good condition.  Follow-up Plans & Appointments    Follow-up Information    Follow up with Almyra Deforest, PA On 01/12/2016.   Specialties:  Cardiology, Radiology   Why:  2:00pm for your hospital follow up with Dr. York Cerise PA   Contact information:   [address] [place] [address] [phone]      Discharge Instructions    Amb Referral to Cardiac Rehabilitation    Complete by:  As directed   Diagnosis:  Coronary Stents Comment - referring to [facility] program     Diet - low sodium heart healthy    Complete by:  As directed      Discharge instructions    Complete by:  As directed   Radial Site Care Refer to this sheet in the next few weeks. These instructions provide you with information on caring for yourself after your procedure. Your caregiver may also give you more specific instructions. Your treatment has been planned according to current medical practices, but problems sometimes occur. Call your caregiver if you have any problems or questions after your procedure. HOME CARE INSTRUCTIONS You may shower the day after the procedure.Remove the bandage (dressing) and gently wash the site with plain soap and water.Gently pat the site dry.  Do not apply powder or lotion to the site.  Do not submerge the affected site in water for 3 to 5 days.  Inspect the site at least twice daily.  Do not flex or bend  the affected arm for 24 hours.  No lifting over 5 pounds (2.3 kg) for 5 days after your procedure.  Do not drive home if you are discharged the same day of the procedure. Have someone else drive you.  You may drive 24 hours after the procedure unless otherwise instructed by your caregiver.  What to expect: Any bruising will usually fade within 1 to 2 weeks.  Blood that collects in the tissue (hematoma) may be painful to the touch. It should usually decrease in size and tenderness within 1 to 2 weeks.  SEEK IMMEDIATE MEDICAL CARE IF: You have unusual pain at the radial site.  You have redness, warmth, swelling, or pain at the radial site.  You have drainage (other than a small amount of blood on the dressing).  You have chills.  You have a  fever or persistent symptoms for more than 72 hours.  You have a fever and your symptoms suddenly get worse.  Your arm becomes pale, cool, tingly, or numb.  You have heavy bleeding from the site. Hold pressure on the site.     Increase activity slowly    Complete by:  As directed            Discharge Medications   Current Discharge Medication List    START taking these medications   Details  insulin detemir (LEVEMIR) 100 UNIT/ML injection Inject 0.35 mLs (35 Units total) into the skin at bedtime. Qty: 10 mL, Refills: 11    nitroGLYCERIN (NITROSTAT) 0.4 MG SL tablet Place 1 tablet (0.4 mg total) under the tongue every 5 (five) minutes as needed for chest pain. Qty: 25 tablet, Refills: 3      CONTINUE these medications which have NOT CHANGED   Details  acetaminophen (TYLENOL) 325 MG tablet Take 650 mg by mouth daily as needed for mild pain.    aspirin EC 81 MG tablet Take 81 mg by mouth every evening.     atorvastatin (LIPITOR) 20 MG tablet Take 1 tablet (20 mg total) by mouth daily. Qty: 90 tablet, Refills: 3   Associated Diagnoses: Coronary artery disease involving native coronary artery of native heart with angina pectoris (HCC)      cetirizine-pseudoephedrine (ZYRTEC-D ALLERGY & CONGESTION) 5-120 MG per tablet Take 1 tablet by mouth daily as needed for allergies.     Co-Enzyme Q10 100 MG CAPS Take 100 mg by mouth daily.    epoetin alfa (EPOGEN,PROCRIT) 29562 UNIT/ML injection Inject 10,000 Units into the skin every 6 (six) weeks. If needed.  Patient taking subcutaneous injection once monthly as directed by his Cancer Doctor. Doesn't recall dosage.    fluticasone (FLONASE) 50 MCG/ACT nasal spray Place 1-2 sprays into both nostrils daily as needed for allergies.     glipiZIDE (GLUCOTROL) 10 MG tablet Take 10 mg by mouth 2 (two) times daily before a meal.    hydrochlorothiazide (MICROZIDE) 12.5 MG capsule Take 12.5 mg by mouth daily.    isosorbide mononitrate (IMDUR) 30 MG 24 hr tablet Take 1 tablet (30 mg total) by mouth daily. Qty: 90 tablet, Refills: 3   Associated Diagnoses: Coronary artery disease involving native coronary artery of native heart with angina pectoris (HCC)    linagliptin (TRADJENTA) 5 MG TABS tablet Take 2.5 mg by mouth daily.     metoprolol succinate (TOPROL-XL) 50 MG 24 hr tablet Take 1 tablet (50 mg total) by mouth daily. Take with or immediately following a meal. Qty: 90 tablet, Refills: 3   Associated Diagnoses: Coronary artery disease involving native coronary artery of native heart with angina pectoris (HCC)    vitamin C (ASCORBIC ACID) 500 MG tablet Take 500 mg by mouth daily.    Vitamin D, Ergocalciferol, (DRISDOL) 50000 units CAPS capsule Take 50,000 Units by mouth every Sunday.       STOP taking these medications     LEVEMIR FLEXTOUCH 100 UNIT/ML Pen      naproxen sodium (ANAPROX) 220 MG tablet          Aspirin prescribed at discharge?  Yes High Intensity Statin Prescribed? (Lipitor 40-80mg  or Crestor 20-40mg ): Yes Beta Blocker Prescribed? Yes For EF <40%, was ACEI/ARB Prescribed? No:  ADP Receptor Inhibitor Prescribed? (i.e. Plavix etc.-Includes Medically Managed Patients):  Yes (Enrolled in Twilight study) For EF <40%, Aldosterone Inhibitor Prescribed? No:  Was EF assessed during THIS hospitalization?  Yes Echo pending Was Cardiac Rehab II ordered? (Included Medically managed Patients): Yes   Outstanding Labs/Studies   Echo results were pending.  Duration of Discharge Encounter   Greater than 30 minutes including physician time.  Signed, Reino Bellis NP-C 01/06/2016, 2:49 PM

## 2016-01-06 NOTE — Progress Notes (Signed)
CARDIAC REHAB PHASE I   PRE:  Rate/Rhythm: 76 SR  BP:  Supine: 157/81  Sitting:   Standing:    SaO2:   MODE:  Ambulation: 800 ft   POST:  Rate/Rhythm: 96 SR  BP:  Supine: 161/80  Sitting:   Standing:    SaO2:  0820-0920 Pt walked 800 ft with steady gait. No CP. Tolerated well. Education completed with pt and wife who voiced understanding. Stressed importance of brilinta with stent. Pt is interested in [place] study. Reviewed CHF booklet and zones of when to call MD since EF low on nuclear study. Gave diabetic, heart healthy and diabetic diets. Discussed carb counting and importance of adhering to 2000 mg sodium. Pt knows to weigh daily and notify cardiologist if he gains 3 lbs overnight or 5 in week. Reviewed NTG use and ex ed. Referring to CRP 2 in [facility] at [place], RN BSN  01/06/2016 9:15 AM

## 2016-01-06 NOTE — Telephone Encounter (Signed)
TCM per Ria Comment 7/27 @ 2pm w/ Isaac Laud

## 2016-01-06 NOTE — Progress Notes (Signed)
Patient Name: Keith Church Date of Encounter: 01/06/2016  Hospital Problem List     Active Problems:   CAD, NATIVE VESSEL   HYPERTENSION, BENIGN   Abnormal nuclear stress test    Subjective   Feeling well this morning. No angina or dyspnea.  Inpatient Medications    . angioplasty book   Does not apply Once  . aspirin EC  81 mg Oral QPM  . atorvastatin  20 mg Oral q1800  . glipiZIDE  10 mg Oral BID AC  . insulin detemir  35 Units Subcutaneous QHS  . isosorbide mononitrate  30 mg Oral Daily  . linagliptin  2.5 mg Oral Daily  . Living Better with Heart Failure Book   Does not apply Once  . metoprolol succinate  50 mg Oral Daily  . sodium chloride flush  3 mL Intravenous Q12H  . ticagrelor  90 mg Oral BID    Vital Signs    Filed Vitals:   01/05/16 2100 01/05/16 2200 01/05/16 2300 01/06/16 0451  BP: 167/74 158/73 167/85 136/82  Pulse: 76 79 79 70  Temp:    98.2 F (36.8 C)  TempSrc:    Oral  Resp: 18 9 12 12   Height:      Weight:    198 lb 6.6 oz (90 kg)  SpO2: 100% 99% 98% 99%    Intake/Output Summary (Last 24 hours) at 01/06/16 0829 Last data filed at 01/06/16 0452  Gross per 24 hour  Intake      0 ml  Output    850 ml  Net   -850 ml   Filed Weights   01/05/16 1157 01/06/16 0451  Weight: 201 lb (91.173 kg) 198 lb 6.6 oz (90 kg)    Physical Exam    General: Pleasant older male, NAD. Neuro: Alert and oriented X 3. Moves all extremities spontaneously. Psych: Normal affect. HEENT:  Normal  Neck: Supple without bruits or JVD. Lungs:  Resp regular and unlabored, CTA. Heart: RRR no s3, s4, or murmurs. Abdomen: Soft, non-tender, non-distended, BS + x 4.  Extremities: No clubbing, cyanosis or edema. DP/PT/Radials 2+ and equal bilaterally. Stable Right radial site, with bruising but no hematoma.   Labs    CBC  Recent Labs  01/04/16 1103 01/06/16 0616  WBC 6.1 6.5  HGB 11.4* 11.0*  HCT 33.3* 32.0*  MCV 85.2 84.7  PLT 132* Q000111Q*   Basic Metabolic  Panel  Recent Labs  01/04/16 1103 01/06/16 0616  NA 141 140  K 4.3 4.5  CL 108 112*  CO2 22 21*  GLUCOSE 263* 193*  BUN 29* 24*  CREATININE 1.89* 1.82*  CALCIUM 8.5* 8.7*   Liver Function Tests No results for input(s): AST, ALT, ALKPHOS, BILITOT, PROT, ALBUMIN in the last 72 hours. No results for input(s): LIPASE, AMYLASE in the last 72 hours. Cardiac Enzymes No results for input(s): CKTOTAL, CKMB, CKMBINDEX, TROPONINI in the last 72 hours. BNP Invalid input(s): POCBNP D-Dimer No results for input(s): DDIMER in the last 72 hours. Hemoglobin A1C No results for input(s): HGBA1C in the last 72 hours. Fasting Lipid Panel No results for input(s): CHOL, HDL, LDLCALC, TRIG, CHOLHDL, LDLDIRECT in the last 72 hours. Thyroid Function Tests No results for input(s): TSH, T4TOTAL, T3FREE, THYROIDAB in the last 72 hours.  Invalid input(s): FREET3  Telemetry    SR with PVCs, PACs  ECG    SR with PACs  Radiology    No results found.  Assessment & Plan  Keith Church is a 81 yo male with PMH of CAD s/p DES to LAD 2007/HTN/HL and DM II who presented to Keith Church office for follow up. At that time he reported having discomfort to the upper chest with walking. Reports he lives at [facility]. The discomfort comes when he is walking his usual routine in the morning. Reported the symptoms had been present for the past 6 months. Underwent a [employer] that was high risk with severe LV dysfunction, multi-territory ischemia, chest pain with exertion, NSVT and significant ST depression. He was taken for [facility] with Keith Church on 01/05/2016.  1. CAD: Cath yesterday showed severe stenosis of the proximal LAD that was successfully treated with PCI and DES. Continued patency of previously stented segment of mid LAD, and calcified coronaries with nonobstructive stenosis to major portions of RCA and left Crx.  -- Recommendations were dual antiplatelet therapy (ASA, Brilinta) for a year. Plans to  participate in the Keith Church study.  -- Stable Hgb, and Cr stable post cath.  -- Will arrange for TOC appt, he reports being able to commute back to [place] for this.  -- 2 D echo pending still this morning.  2. HTN: Stable this morning. Continue current medications.   3. HL: On statin  Signed, Keith Bellis NP-C Pager [phone] Patient seen and examined and history reviewed. Agree with above findings and plan. Feeling well today. No chest pain or dyspnea. Enrolling in Keith Church trial. Right radial site with some bruising but no hematoma and pulses are good.  Awaiting Echo. EF 27% by myoview. Not a candidate for ACEi/ARB due to CKD. On Coreg and nitrates. Hopefully EF will improve with revascularization. Ready for DC today.  Keith Church, Keith Church 01/06/2016 9:58 AM

## 2016-01-06 NOTE — Care Management Note (Signed)
Case Management Note  Patient Details  Name: Keith Church MRN: [id number] Date of Birth: 1934/10/17  Subjective/Objective:   Patient is from home s/p stent intervention, will be on Brilinta, possible twilight study.  NCM will cont to follow for dc needs.                  Action/Plan:   Expected Discharge Date:                  Expected Discharge Plan:  Home/Self Care  In-House Referral:     Discharge planning Services  CM Consult  Post Acute Care Choice:    Choice offered to:     DME Arranged:    DME Agency:     HH Arranged:    HH Agency:     Status of Service:  In process, will continue to follow  If discussed at Long Length of Stay Meetings, dates discussed:    Additional Comments:  Zenon Mayo, RN 01/06/2016, 11:46 AM

## 2016-01-09 NOTE — Telephone Encounter (Signed)
Unable to reach pt or leave a message  

## 2016-01-09 NOTE — Progress Notes (Signed)
**  Oct Echo**

## 2016-01-10 ENCOUNTER — Other Ambulatory Visit: Payer: Self-pay | Admitting: *Deleted

## 2016-01-10 MED ORDER — AMBULATORY NON FORMULARY MEDICATION: 81.0000 mg | Freq: Every day | Status: DC

## 2016-01-10 MED ORDER — AMBULATORY NON FORMULARY MEDICATION: 90.0000 mg | Freq: Two times a day (BID) | Status: DC

## 2016-01-10 NOTE — Telephone Encounter (Signed)
Patient contacted regarding discharge from [facility] on January 06, 2016.  Patient understands to follow up with provider Almyra Deforest, PA on January 12, 2016 at 2pm at [facility]. Patient understands discharge instructions? yes Patient understands medications and regiment? yes Patient understands to bring all medications to this visit? yes

## 2016-01-10 NOTE — Research (Signed)
Brillenta and Asa dosage reviewed with patient and his wife. Patient verbalized to take Brillenta 90 mg every 12 hours and Asa 81 mg once daily. Reviewed bleeding precautions. Patient verbalized when to call for medical attention. Aware Study Coordinator will call him in once month.

## 2016-01-11 ENCOUNTER — Encounter: Payer: Self-pay | Admitting: *Deleted

## 2016-01-12 ENCOUNTER — Encounter: Payer: Self-pay | Admitting: Physician Assistant

## 2016-01-12 ENCOUNTER — Ambulatory Visit (INDEPENDENT_AMBULATORY_CARE_PROVIDER_SITE_OTHER): Payer: Medicare Other | Admitting: Physician Assistant

## 2016-01-12 VITALS — BP 134/78 | HR 86 | Ht 71.0 in | Wt 199.4 lb

## 2016-01-12 DIAGNOSIS — E785 Hyperlipidemia, unspecified: Secondary | ICD-10-CM

## 2016-01-12 DIAGNOSIS — I10 Essential (primary) hypertension: Secondary | ICD-10-CM

## 2016-01-12 DIAGNOSIS — I255 Ischemic cardiomyopathy: Secondary | ICD-10-CM

## 2016-01-12 DIAGNOSIS — N183 Chronic kidney disease, stage 3 unspecified: Secondary | ICD-10-CM

## 2016-01-12 DIAGNOSIS — I25118 Atherosclerotic heart disease of native coronary artery with other forms of angina pectoris: Secondary | ICD-10-CM | POA: Diagnosis not present

## 2016-01-12 DIAGNOSIS — E119 Type 2 diabetes mellitus without complications: Secondary | ICD-10-CM

## 2016-01-12 LAB — BASIC METABOLIC PANEL
BUN: 38 mg/dL — ABNORMAL HIGH (ref 7–25)
CO2: 21 mmol/L (ref 20–31)
Calcium: 8.8 mg/dL (ref 8.6–10.3)
Chloride: 104 mmol/L (ref 98–110)
Creat: 2.27 mg/dL — ABNORMAL HIGH (ref 0.70–1.18)
Glucose, Bld: 359 mg/dL — ABNORMAL HIGH (ref 65–99)
POTASSIUM: 4.7 mmol/L (ref 3.5–5.3)
SODIUM: 138 mmol/L (ref 135–146)

## 2016-01-12 MED ORDER — ATORVASTATIN CALCIUM 40 MG PO TABS: 40.0000 mg | ORAL_TABLET | Freq: Every day | ORAL | 3 refills | Status: DC

## 2016-01-12 MED ORDER — LISINOPRIL 2.5 MG PO TABS: 2.5000 mg | ORAL_TABLET | Freq: Every day | ORAL | 3 refills | Status: DC

## 2016-01-12 NOTE — Patient Instructions (Signed)
Medication Instructions:  1. INCREASE Lipitor to 40mg  take 1 tab daily. 2. START Lisinopril. Take 1 (2.5mg ) tab by mouth daily.  Labwork: Your physician recommends that you have labs TODAY:BMET  Testing/Procedures: Your physician has requested that you have an echocardiogram. Echocardiography is a painless test that uses sound waves to create images of your heart. It provides your doctor with information about the size and shape of your heart and how well your heart's chambers and valves are working. This procedure takes approximately one hour. There are no restrictions for this procedure.  THIS IS TO BE DONE IN 3 MONTHS IN THE AFTERNOON-PATIENT [place]IV[place]  Follow-Up: Your physician recommends that you schedule a follow-up appointment in: WITH DR COOPER 3 MONTHS DAY AFTER ECHO APPT IN THE MORNING PER PA   Any Other Special Instructions Will Be Listed Below (If Applicable).     If you need a refill on your cardiac medications before your next appointment, please call your pharmacy.

## 2016-01-12 NOTE — Progress Notes (Addendum)
Cardiology Office Note    Date:  01/12/2016   ID:  GIRISH LEVIT, DOB Jun 11, 1935, MRN [id number]  PCP:  [facility]  Cardiologist:  Dr. Burt Knack   Nephrology:  Dr. Marlynn Perking of Nephrology at [facility]  Chief Complaint  Patient presents with  . Transitions Of Care    seen for Dr. Burt Knack, post PCI    History of Present Illness:  ANARI TINSON is a 81 y.o. male with PMH of CAD s/p PCI/DES to LAD in 2007, CKD stage III, HTN, DM II and HLD presents today for 7 day Transition of Care visit. He had a Myoview in [place] in December 2015 that showed EF 55-60%. He was last seen in the office on 12/01/2015 at which time he had new exertional angina. Imdur 30 mg daily was added. He underwent treadmill Myoview on 12/29/2015 which showed EF 27%, small reversible defect of mild severity in the apex location. Given the abnormal Myoview with drop in EF, he underwent outpatient cardiac catheterization on 01/05/2016 which showed 40% mid RCA lesion, 90% distal left circumflex lesion, 30% mid LAD lesion previously treated with a stent, 95% proximal LAD lesion treated with 3.5 x 12 mm Promus DES. Postprocedure, he was started on aspirin and Brilinta. Echocardiogram performed on 01/06/2016 showed EF 99991111, grade 1 diastolic dysfunction, akinesis of the mid apical inferior and apical myocardium, no sign of apical thrombus using Definity. He has been enrolled in the [place] study (which will provide ASA and Brilinta, after 3 month, his ASA will be randomized to placebo vs ASA arm).  He has been doing well since he left the hospital, he denies any chest discomfort. He continued to walk on a daily basis without significant exertional type symptoms. The symptom he had prior to cardiac catheterization has completely resolved. He is followed by Dr. Marlynn Perking of Nephrology for his CKD stage III in [facility]. He is on Imdur and Toprol-XL. Given his ischemic cardiomyopathy, I  recommended either lisinopril, losartan or hydralazine. Given his diabetic nephropathy history, I think he would benefit from lisinopril. I had discussed with our clinical pharmacist. I will add to 2.5 mg lisinopril to his medical regimen. We will obtain basic metabolic panel today and repeat another basic metabolic panel in one week to assess his potassium and renal function. He denies any history of adverse reaction with Lipitor, given his coronary artery disease history, I will increase his Lipitor to 40 mg daily. We will obtain a repeat echocardiogram in 3 month and have him follow-up with Dr. Burt Knack on the following day.    Past Medical History:  Diagnosis Date  . CAD (coronary artery disease) 2007   a. 12/2015 DES to LAD b.drug-eluting stent to teeat severe lad stenosis  . Chronic kidney disease (CKD), stage III (moderate)   . HTN (hypertension)   . Hyperlipidemia   . Kidney stones   . Prostate cancer ([place]) ~ 2016  . Type II diabetes mellitus ([place])     Past Surgical History:  Procedure Laterality Date  . CARDIAC CATHETERIZATION  1990s   "no stent"  . CARDIAC CATHETERIZATION N/A 01/05/2016   Procedure: Left Heart Cath and Coronary Angiography;  Surgeon: Sherren Mocha, MD;  Location: [facility];  Service: Cardiovascular;  Laterality: N/A;  . CARDIAC CATHETERIZATION N/A 01/05/2016   Procedure: Coronary Stent Intervention;  Surgeon: Sherren Mocha, MD;  Location: [facility];  Service: Cardiovascular;  Laterality: N/A;  .  CATARACT EXTRACTION W/ INTRAOCULAR LENS  IMPLANT, BILATERAL Bilateral   . CORONARY ANGIOPLASTY WITH STENT PLACEMENT  2007   drug-eluting stent to treat severe lad stenosis  . CORONARY ANGIOPLASTY WITH STENT PLACEMENT  01/05/2016  . CYSTOSCOPY W/ STONE MANIPULATION  07/2015  . LAPAROSCOPIC CHOLECYSTECTOMY    . PROSTATE BIOPSY  ~ 2016    Current Medications: Outpatient Medications Prior to Visit  Medication Sig Dispense Refill  . acetaminophen  (TYLENOL) 325 MG tablet Take 650 mg by mouth daily as needed for mild pain.    Marland Kitchen AMBULATORY NON FORMULARY MEDICATION Take 90 mg by mouth 2 (two) times daily. Medication Name: Brilinta 90 mg BID (TWILIGHT Research study provided)    . AMBULATORY NON FORMULARY MEDICATION Take 81 mg by mouth daily. Medication Name: Aspirin 81 mg Daily (TWILIGHT Research study provided)    . cetirizine-pseudoephedrine (ZYRTEC-D ALLERGY & CONGESTION) 5-120 MG per tablet Take 1 tablet by mouth daily as needed for allergies.     Marland Kitchen Co-Enzyme Q10 100 MG CAPS Take 100 mg by mouth daily.    Marland Kitchen epoetin alfa (EPOGEN,PROCRIT) 28413 UNIT/ML injection Inject 10,000 Units into the skin every 6 (six) weeks. If needed.  Patient taking subcutaneous injection once monthly as directed by his Cancer Doctor. Doesn't recall dosage.    . fluticasone (FLONASE) 50 MCG/ACT nasal spray Place 1-2 sprays into both nostrils daily as needed for allergies.     Marland Kitchen glipiZIDE (GLUCOTROL) 10 MG tablet Take 10 mg by mouth 2 (two) times daily before a meal.    . hydrochlorothiazide (MICROZIDE) 12.5 MG capsule Take 12.5 mg by mouth daily.    . insulin detemir (LEVEMIR) 100 UNIT/ML injection Inject 0.35 mLs (35 Units total) into the skin at bedtime. 10 mL 11  . isosorbide mononitrate (IMDUR) 30 MG 24 hr tablet Take 1 tablet (30 mg total) by mouth daily. 90 tablet 3  . linagliptin (TRADJENTA) 5 MG TABS tablet Take 2.5 mg by mouth daily.     . metoprolol succinate (TOPROL-XL) 50 MG 24 hr tablet Take 1 tablet (50 mg total) by mouth daily. Take with or immediately following a meal. 90 tablet 3  . nitroGLYCERIN (NITROSTAT) 0.4 MG SL tablet Place 1 tablet (0.4 mg total) under the tongue every 5 (five) minutes as needed for chest pain. 25 tablet 3  . vitamin C (ASCORBIC ACID) 500 MG tablet Take 500 mg by mouth daily.    . Vitamin D, Ergocalciferol, (DRISDOL) 50000 units CAPS capsule Take 50,000 Units by mouth every Sunday.     Marland Kitchen atorvastatin (LIPITOR) 20 MG tablet  Take 1 tablet (20 mg total) by mouth daily. 90 tablet 3   No facility-administered medications prior to visit.      Allergies:   Review of patient's allergies indicates no known allergies.   Social History   Social History  . Marital status: Married    Spouse name: N/A  . Number of children: N/A  . Years of education: N/A   Occupational History  . retired    Social History Main Topics  . Smoking status: Never Smoker  . Smokeless tobacco: Former Systems developer    Types: Chew     Comment: "quit chewing by the age of 77"  . Alcohol use No  . Drug use: No  . Sexual activity: No   Other Topics Concern  . None   Social History Narrative  . None     Family History:  The patient's family history includes Heart disease in  his mother; Stroke in his father.   ROS:   Please see the history of present illness.    ROS All other systems reviewed and are negative.   PHYSICAL EXAM:   VS:  BP 134/78   Pulse 86   Ht 5\' 11"  (1.803 m)   Wt 199 lb 6.4 oz (90.4 kg)   SpO2 98%   BMI 27.81 kg/m    GEN: Well nourished, well developed, in no acute distress  HEENT: normal  Neck: no JVD, carotid bruits, or masses Cardiac: RRR; no murmurs, rubs, or gallops,no edema  Respiratory:  clear to auscultation bilaterally, normal work of breathing GI: soft, nontender, nondistended, + BS MS: no deformity or atrophy  Skin: warm and dry, no rash Neuro:  Alert and Oriented x 3, Strength and sensation are intact Psych: euthymic mood, full affect  Wt Readings from Last 3 Encounters:  01/12/16 199 lb 6.4 oz (90.4 kg)  01/06/16 198 lb 6.6 oz (90 kg)  12/01/15 203 lb 12.8 oz (92.4 kg)      Studies/Labs Reviewed:   EKG:  EKG is ordered today.  The ekg ordered today demonstrates NSR with PACs and PVCs.   Recent Labs: 01/06/2016: BUN 24; Creatinine, Ser 1.82; Hemoglobin 11.0; Platelets 134; Potassium 4.5; Sodium 140   Lipid Panel No results found for: CHOL, TRIG, HDL, CHOLHDL, VLDL, LDLCALC,  LDLDIRECT  Additional studies/ records that were reviewed today include:    Myoview 12/29/2015  Nuclear stress EF: 27%. Asynchronous contraction. EF severely reduced.  Brief salvos of nonsustained ventricular tachycardia noted during stress.  Defect 1: There is a small reversible defect of mild severity present in the apex location.  This is a high risk study secondary to ventricular arrhythmias seen during stress as well as reduced ejection fraction. Metoprolol was administered.   Cath 01/05/2016 Conclusion    Mid RCA lesion, 40% stenosed.  Dist Cx lesion, 90% stenosed.  Mid LAD lesion, 30% stenosed. The lesion was previously treated with a stent (unknown type).  Prox LAD lesion, 95% stenosed. Post intervention, there is a 0% residual stenosis.   1. Severe stenosis of the proximal LAD, treated successfully with PCI using a 3.5 x 12 mm Promus DES 2. Continued patency of the stented segment in the mid LAD with mild diffuse in-stent restenosis 3. Heavily calcified coronary arteries with nonobstructive stenosis in the major portions of the RCA and left circumflex and severe stenosis of the distal left circumflex extending into to the PLA branch  Recommendations: Dual antiplatelet therapy with aspirin and brilinta for 12 months or consideration of the [place] clinical trial. Ongoing risk reduction efforts.     Echo 01/06/2016 LV EF: 30% -   35%  ------------------------------------------------------------------- Indications:      Cardiomyopathy - ischemic 414.8.  ------------------------------------------------------------------- History:   PMH:   Coronary artery disease.  Risk factors: Hypertension. Diabetes mellitus.  ------------------------------------------------------------------- Study Conclusions  - Left ventricle: The cavity size was normal. Wall thickness was   increased in a pattern of mild LVH. Systolic function was   moderately to severely reduced. The  estimated ejection fraction   was in the range of 30% to 35%. There is akinesis of the   mid-apicalinferior and apical myocardium. Doppler parameters are   consistent with abnormal left ventricular relaxation (grade 1   diastolic dysfunction).  Impressions:  - Global hypokinesis with akinesis of the distal inferior wall and   apex; overall moderate to severe LV dysfunction; grade 1   diastolic dysfunction; no  apical thrombus noted using definity;   trace MR and TR.   ASSESSMENT:    1. Atherosclerosis of native coronary artery of native heart with other form of angina pectoris ([place])   2. Ischemic cardiomyopathy   3. CKD (chronic kidney disease), stage III   4. Essential hypertension   5. Diabetes mellitus type 2 in nonobese (H[facility])   6. Hyperlipidemia      PLAN:  In order of problems listed above:  1. CAD: exertional angina resolved after cath  - s/p PCI/DES to LAD in 2007, s/p DES in 12/2015 at prox LAD  - Discussed with patient the need for DAPT, he has been enrolled in [place] study.  2. ICM with baseline EF 30-35% post cath  - will start lisinopril today given stable diabetic CKD stage III with baseline Cr 1.8 and cardiomyopathy. Continue Toprol XL and Imdur  - check BMET today, will obtain BMET in 1 week  3. CKD stage III  - followed by Dr. Marlynn Perking of Nephrology at [facility]  4. HTN: BP 134/78, add low dose lisinopril  5. DM II: continue on current medication, manage by PCP  6. HLD: increase lipitor to 40mg  daily.  This is a 7 day Transition of Care followup.    Medication Adjustments/Labs and Tests Ordered: Current medicines are reviewed at length with the patient today.  Concerns regarding medicines are outlined above.  Medication changes, Labs and Tests ordered today are listed in the Patient Instructions below. Patient Instructions  Medication Instructions:  1. INCREASE Lipitor to 40mg  take 1 tab daily. 2. START Lisinopril. Take 1 (2.5mg )  tab by mouth daily.  Labwork: Your physician recommends that you have labs TODAY:BMET  Testing/Procedures: Your physician has requested that you have an echocardiogram. Echocardiography is a painless test that uses sound waves to create images of your heart. It provides your doctor with information about the size and shape of your heart and how well your heart's chambers and valves are working. This procedure takes approximately one hour. There are no restrictions for this procedure.  THIS IS TO BE DONE IN 3 MONTHS IN THE AFTERNOON-PATIENT LIVES IN [place]  Follow-Up: Your physician recommends that you schedule a follow-up appointment in: WITH DR COOPER 3 MONTHS DAY AFTER ECHO APPT IN THE MORNING PER PA   Any Other Special Instructions Will Be Listed Below (If Applicable).     If you need a refill on your cardiac medications before your next appointment, please call your pharmacy.      Hilbert Corrigan, [place]  01/12/2016 7:01 PM    [facility] [address], [place], [place]  [address] Phone: [phone]; Fax: [phone]

## 2016-01-13 ENCOUNTER — Telehealth: Payer: Self-pay | Admitting: Cardiovascular Disease

## 2016-01-13 ENCOUNTER — Telehealth: Payer: Self-pay | Admitting: Physician Assistant

## 2016-01-13 DIAGNOSIS — N289 Disorder of kidney and ureter, unspecified: Secondary | ICD-10-CM

## 2016-01-13 NOTE — Telephone Encounter (Signed)
Returned pts call.  No answer and no voicemail set up.

## 2016-01-13 NOTE — Telephone Encounter (Signed)
New message ° ° ° ° ° ° °Pt returning nurse call  °

## 2016-01-13 NOTE — Telephone Encounter (Signed)
Fu  Pt returning RN phone call- lab results. Please call back and discuss.   

## 2016-01-17 NOTE — Telephone Encounter (Signed)
Spoke with pt 01/13/16 and got all pcp and nephrologist information to send his lab results to. Pt agreeable with this.

## 2016-01-20 ENCOUNTER — Encounter: Payer: Self-pay | Admitting: Physician Assistant

## 2016-01-25 ENCOUNTER — Telehealth: Payer: Self-pay | Admitting: *Deleted

## 2016-01-25 DIAGNOSIS — I10 Essential (primary) hypertension: Secondary | ICD-10-CM

## 2016-01-25 NOTE — Telephone Encounter (Signed)
Pt is aware of his lab results.  He will repeat bmet 02/02/16 @ [facility] office, order faxed to them [phone] Pt advised to f.u with pcp re: A1C.  Pt agreeable with this plan.

## 2016-01-25 NOTE — Telephone Encounter (Signed)
-----   Message from [place], [place] sent at 01/25/2016  2:25 PM EDT ----- Also continue to followup with PCP, his diabetes is uncontrolled. His Hemoglobin A1C was 9.2.

## 2016-02-02 ENCOUNTER — Encounter: Payer: Self-pay | Admitting: Physician Assistant

## 2016-02-03 NOTE — Telephone Encounter (Signed)
-----   Message from [place], [place] sent at 02/03/2016  1:15 PM EDT ----- Kidney function worsened, instruct the patient to stop lisinopril permanently. Will not attempt ACEI or ARB again. Also tell him to hold hydrochlorothiazide and hydrate himself. Will need to repeat kidney function in a week.

## 2016-02-07 ENCOUNTER — Encounter: Payer: Self-pay | Admitting: *Deleted

## 2016-02-07 NOTE — Progress Notes (Signed)
TWILIGHT Research Study month 1 telephone follow up completed. Patient denies any bleeding or other adverse events. He states he has been compliant with Brilinta and ASA. Next research appointment has been scheduled 04/12/16 @ 2:15. At this appointment he will be randomized to ASA 81 mg daily or PLACEBO pending no adverse events prior to visit. Questions encouraged and answered.

## 2016-02-09 ENCOUNTER — Encounter: Payer: Self-pay | Admitting: Physician Assistant

## 2016-04-12 ENCOUNTER — Other Ambulatory Visit: Payer: Self-pay | Admitting: *Deleted

## 2016-04-12 ENCOUNTER — Encounter: Payer: Self-pay | Admitting: *Deleted

## 2016-04-12 ENCOUNTER — Ambulatory Visit (HOSPITAL_COMMUNITY): Payer: Medicare Other | Attending: Cardiology

## 2016-04-12 DIAGNOSIS — I5030 Unspecified diastolic (congestive) heart failure: Secondary | ICD-10-CM | POA: Diagnosis not present

## 2016-04-12 DIAGNOSIS — I351 Nonrheumatic aortic (valve) insufficiency: Secondary | ICD-10-CM | POA: Insufficient documentation

## 2016-04-12 DIAGNOSIS — I25118 Atherosclerotic heart disease of native coronary artery with other forms of angina pectoris: Secondary | ICD-10-CM | POA: Diagnosis not present

## 2016-04-12 DIAGNOSIS — Z006 Encounter for examination for normal comparison and control in clinical research program: Secondary | ICD-10-CM

## 2016-04-12 MED ORDER — AMBULATORY NON FORMULARY MEDICATION: 81.0000 mg | Freq: Every day | Status: DC

## 2016-04-12 NOTE — Progress Notes (Signed)
TWILIGHT Research study month 3 randomization visit completed. Patient denies any bleeding events or any other adverse events. He states he has been compliant with study provided medication. Today he has been randomized to ASA 81 mg daily or PLACEBO for the TWILIGHT protocol. He was dispensed bottle #'s [id number]; [id number]. He returned bottle # [id number] with 14 Brilinta remaining and [id number] 4 pills remaining. A research required appointment schedule was give to patient. Questions encouraged and answered. Next research telephone visit will be no later than 05/11/16.

## 2016-04-13 ENCOUNTER — Other Ambulatory Visit: Payer: Self-pay

## 2016-04-13 ENCOUNTER — Ambulatory Visit (HOSPITAL_COMMUNITY): Payer: Medicare Other | Attending: Cardiovascular Disease

## 2016-04-13 ENCOUNTER — Ambulatory Visit (INDEPENDENT_AMBULATORY_CARE_PROVIDER_SITE_OTHER): Payer: Medicare Other | Admitting: Cardiovascular Disease

## 2016-04-13 ENCOUNTER — Encounter: Payer: Self-pay | Admitting: Cardiovascular Disease

## 2016-04-13 VITALS — BP 134/60 | HR 84 | Ht 71.5 in | Wt 198.0 lb

## 2016-04-13 DIAGNOSIS — I5020 Unspecified systolic (congestive) heart failure: Secondary | ICD-10-CM | POA: Diagnosis not present

## 2016-04-13 DIAGNOSIS — I25118 Atherosclerotic heart disease of native coronary artery with other forms of angina pectoris: Secondary | ICD-10-CM

## 2016-04-13 DIAGNOSIS — I209 Angina pectoris, unspecified: Secondary | ICD-10-CM

## 2016-04-13 DIAGNOSIS — I255 Ischemic cardiomyopathy: Secondary | ICD-10-CM | POA: Diagnosis not present

## 2016-04-13 LAB — ECHOCARDIOGRAM LIMITED
HEIGHTINCHES: 71.5 in
Weight: 3168 oz

## 2016-04-13 MED ORDER — PERFLUTREN LIPID MICROSPHERE
1.0000 mL | INTRAVENOUS | Status: AC | PRN
  Administered 2016-04-13: 2 mL via INTRAVENOUS

## 2016-04-13 NOTE — Progress Notes (Signed)
Cardiology Office Note Date:  04/15/2016   ID:  Keith Church, DOB 08-Jan-1935, MRN [id number]  PCP:  [facility]  Cardiologist:  Sherren Mocha, MD    No chief complaint on file.    History of Present Illness: Keith Church is a 81 y.o. male who presents for follow-up of CAD and ischemic cardiomyopathy. He presented in June 2017 with exertional angina, described as right upper chest pressure with walking. He underwent a Myoview stress test which was high-risk. Cath showed critical proximal LAD stenosis and he underwent PCI using a drug-eluting stent. He returns with his wife today.  He's doing well. No recurrent angina. Followed closely by his PCP at [facility]. Also sees nephrology for management of CKD. No dyspnea, edema, orthopnea, PND, or chest pain. Still playing golf up to 3-4 times/week.   Past Medical History:  Diagnosis Date  . CAD (coronary artery disease) 2007   a. 12/2015 DES to LAD b.drug-eluting stent to teeat severe lad stenosis  . Chronic kidney disease (CKD), stage III (moderate)   . HTN (hypertension)   . Hyperlipidemia   . Kidney stones   . Prostate cancer ([place]) ~ 2016  . Type II diabetes mellitus ([place])     Past Surgical History:  Procedure Laterality Date  . CARDIAC CATHETERIZATION  1990s   "no stent"  . CARDIAC CATHETERIZATION N/A 01/05/2016   Procedure: Left Heart Cath and Coronary Angiography;  Surgeon: Sherren Mocha, MD;  Location: [place] [facility];  Service: Cardiovascular;  Laterality: N/A;  . CARDIAC CATHETERIZATION N/A 01/05/2016   Procedure: Coronary Stent Intervention;  Surgeon: Sherren Mocha, MD;  Location: [place] [facility];  Service: Cardiovascular;  Laterality: N/A;  . CATARACT EXTRACTION W/ INTRAOCULAR LENS  IMPLANT, BILATERAL Bilateral   . CORONARY ANGIOPLASTY WITH STENT PLACEMENT  2007   drug-eluting stent to treat severe lad stenosis  . CORONARY ANGIOPLASTY WITH STENT PLACEMENT  01/05/2016  . CYSTOSCOPY W/ STONE  MANIPULATION  07/2015  . LAPAROSCOPIC CHOLECYSTECTOMY    . PROSTATE BIOPSY  ~ 2016    Current Outpatient Prescriptions  Medication Sig Dispense Refill  . acetaminophen (TYLENOL) 325 MG tablet Take 650 mg by mouth daily as needed for mild pain.    Marland Kitchen AMBULATORY NON FORMULARY MEDICATION Take 90 mg by mouth 2 (two) times daily. Medication Name: Brilinta 90 mg BID (TWILIGHT Research study provided)    . AMBULATORY NON FORMULARY MEDICATION Take 81 mg by mouth daily. Medication Name: Aspirin 81 mg Daily or PLACEBO (TWILIGHT Research Study PROVIDED)    . atorvastatin (LIPITOR) 40 MG tablet Take 1 tablet (40 mg total) by mouth daily. 90 tablet 3  . cetirizine-pseudoephedrine (ZYRTEC-D ALLERGY & CONGESTION) 5-120 MG per tablet Take 1 tablet by mouth daily as needed for allergies.     Marland Kitchen Co-Enzyme Q10 100 MG CAPS Take 100 mg by mouth daily.    Marland Kitchen epoetin alfa (EPOGEN,PROCRIT) 13086 UNIT/ML injection Inject 10,000 Units into the skin every 6 (six) weeks. If needed.  Patient taking subcutaneous injection once monthly as directed by his Cancer Doctor. Doesn't recall dosage.    . fluticasone (FLONASE) 50 MCG/ACT nasal spray Place 1-2 sprays into both nostrils daily as needed for allergies.     Marland Kitchen glipiZIDE (GLUCOTROL) 10 MG tablet Take 10 mg by mouth 2 (two) times daily before a meal.    . insulin detemir (LEVEMIR) 100 UNIT/ML injection Inject 0.35 mLs (35 Units total) into the skin at bedtime. 10  mL 11  . isosorbide mononitrate (IMDUR) 30 MG 24 hr tablet Take 1 tablet (30 mg total) by mouth daily. 90 tablet 3  . linagliptin (TRADJENTA) 5 MG TABS tablet Take 2.5 mg by mouth daily.     . metoprolol succinate (TOPROL-XL) 50 MG 24 hr tablet Take 1 tablet (50 mg total) by mouth daily. Take with or immediately following a meal. 90 tablet 3  . nitroGLYCERIN (NITROSTAT) 0.4 MG SL tablet Place 1 tablet (0.4 mg total) under the tongue every 5 (five) minutes as needed for chest pain. 25 tablet 3  . vitamin C (ASCORBIC ACID)  500 MG tablet Take 500 mg by mouth daily.    . Vitamin D, Ergocalciferol, (DRISDOL) 50000 units CAPS capsule Take 50,000 Units by mouth every Sunday.      No current facility-administered medications for this visit.     Allergies:   Review of patient's allergies indicates no known allergies.   Social History:  The patient  reports that he has never smoked. He has quit using smokeless tobacco. His smokeless tobacco use included Chew. He reports that he does not drink alcohol or use drugs.   Family History:  The patient's  family history includes Heart disease in his mother; Stroke in his father.    ROS:  Please see the history of present illness.   All other systems are reviewed and negative.    PHYSICAL EXAM: VS:  BP 134/60   Pulse 84   Ht 5' 11.5" (1.816 m)   Wt 198 lb (89.8 kg)   BMI 27.23 kg/m  , BMI Body mass index is 27.23 kg/m. GEN: Well nourished, well developed, in no acute distress  HEENT: normal  Neck: no JVD, no masses. No carotid bruits Cardiac: RRR without murmur or gallop                Respiratory:  clear to auscultation bilaterally, normal work of breathing GI: soft, nontender, nondistended, + BS MS: no deformity or atrophy  Ext: no pretibial edema, pedal pulses 2+= bilaterally Skin: warm and dry, no rash Neuro:  Strength and sensation are intact Psych: euthymic mood, full affect  EKG:  EKG is not ordered today.  Recent Labs: 01/06/2016: Hemoglobin 11.0; Platelets 134 01/12/2016: BUN 38; Creat 2.27; Potassium 4.7; Sodium 138   Lipid Panel  No results found for: CHOL, TRIG, HDL, CHOLHDL, VLDL, LDLCALC, LDLDIRECT    Wt Readings from Last 3 Encounters:  04/13/16 198 lb (89.8 kg)  01/12/16 199 lb 6.4 oz (90.4 kg)  01/06/16 198 lb 6.6 oz (90 kg)     Cardiac Studies Reviewed: Echo 04-12-2016: Study Conclusions  - Left ventricle: Poorly visualized endocardial segments limits   accurate assessment of LVF and wall motion. There appears to be   abnormal  wall motion in the inferospetal walls but other walls   cannot be commented on with accuracy . Recommend limited echo   with definity contrast. The cavity size was normal. Images were   inadequate for LV wall motion assessment. There was an increased   relative contribution of atrial contraction to ventricular   filling. Doppler parameters are consistent with abnormal left   ventricular relaxation (grade 1 diastolic dysfunction). - Aortic valve: Mildly calcified annulus. Trileaflet; normal   thickness leaflets. There was trivial regurgitation. - Left atrium: The atrium was mildly dilated. - Pulmonary arteries: PA peak pressure: 31 mm Hg (S).  Cardiac Cath 01-05-2016: Conclusion    Mid RCA lesion, 40% stenosed.  Dist  Cx lesion, 90% stenosed.  Mid LAD lesion, 30% stenosed. The lesion was previously treated with a stent (unknown type).  Prox LAD lesion, 95% stenosed. Post intervention, there is a 0% residual stenosis.   1. Severe stenosis of the proximal LAD, treated successfully with PCI using a 3.5 x 12 mm Promus DES 2. Continued patency of the stented segment in the mid LAD with mild diffuse in-stent restenosis 3. Heavily calcified coronary arteries with nonobstructive stenosis in the major portions of the RCA and left circumflex and severe stenosis of the distal left circumflex extending into to the PLA branch  Recommendations: Dual antiplatelet therapy with aspirin and brilinta for 12 months or consideration of the Twilight clinical trial. Ongoing risk reduction efforts.    ASSESSMENT AND PLAN: 1.  CAD, native vessel, without angina: pt doing well after PCI of the proximal LAD in July 2017. He is enrolled in the Twilight Trial, randomizing him to ASA 81 mg or placebo at 3 months after PCI (ticagrelor 90 mg BID given in both arms) through one year. I would like to see him back at one year to review medication options at that point. Continue current risk reduction measures.  Diet/exercise/lifestyle changes reviewed.  2. CKD III: followed by nephrology near his home in [place], [place]. Upcoming appt next week.  3. Type II DM: followed by his PCP. Treated with oral hypoglycemics and insulin.  4. Ischemic cardiomyopathy: echo images reviewed. LVEF not able to be quantified by current study. Will repeat with contrast agent for better assessment. Previous LVEF 30-35% - I think this has improved based on my review of the images. Await contrast echo study.  Current medicines are reviewed with the patient today.  The patient does not have concerns regarding medicines.  Labs/ tests ordered today include:   Orders Placed This Encounter  Procedures  . ECHOCARDIOGRAM LIMITED    Disposition:   FU July 2018  Deatra James, MD  04/15/2016 9:27 PM    [facility], [place], [place]  [address] Phone: [phone]; Fax: [phone]   Addendum: Contrast Echo shows improved LV function as suspected:  Study Conclusions  - Procedure narrative: Transthoracic echocardiography. Image   quality was adequate. Intravenous contrast (Definity) was   administered. - Left ventricle: The cavity size was normal. Wall thickness was   normal. Systolic function was mildly to moderately reduced. The   estimated ejection fraction was in the range of 40% to 45%.   Moderate hypokinesis of the mid-apicalanteroseptal, anterior, and   apical myocardium; consistent with ischemia or infarction in the   distribution of the left anterior descending coronary artery.  Sherren Mocha 04/15/2016 9:29 PM

## 2016-04-13 NOTE — Patient Instructions (Signed)
Medication Instructions:  None  Labwork: None  Testing/Procedures: Your physician has requested that you have an echocardiogram. Echocardiography is a painless test that uses sound waves to create images of your heart. It provides your doctor with information about the size and shape of your heart and how well your heart's chambers and valves are working. This procedure takes approximately one hour. There are no restrictions for this procedure.    Follow-Up: Your physician wants you to follow-up in: June or July with Dr. Burt Knack.  You will receive a reminder letter in the mail two months in advance. If you don't receive a letter, please call our office to schedule the follow-up appointment.    Any Other Special Instructions Will Be Listed Below (If Applicable).     If you need a refill on your cardiac medications before your next appointment, please call your pharmacy.

## 2016-04-15 ENCOUNTER — Encounter: Payer: Self-pay | Admitting: Cardiovascular Disease

## 2016-05-02 ENCOUNTER — Encounter: Payer: Self-pay | Admitting: *Deleted

## 2016-05-02 DIAGNOSIS — Z006 Encounter for examination for normal comparison and control in clinical research program: Secondary | ICD-10-CM

## 2016-05-02 NOTE — Progress Notes (Signed)
TWILIGHT Research study month 4 telephone visit completed. Patient denies any bleeding events or other adverse events. He states he has been compliant with medication. Next research required visit id due no later than 16/MAY/2018 & research will call to schedule. Questions encouraged and answered.

## 2016-05-25 ENCOUNTER — Encounter (HOSPITAL_COMMUNITY): Payer: Self-pay | Admitting: *Deleted

## 2016-08-04 ENCOUNTER — Inpatient Hospital Stay (HOSPITAL_COMMUNITY): Payer: Medicare Other

## 2016-08-04 ENCOUNTER — Encounter (HOSPITAL_COMMUNITY): Payer: Self-pay | Admitting: Internal Medicine

## 2016-08-04 ENCOUNTER — Inpatient Hospital Stay (HOSPITAL_COMMUNITY)
Admission: AD | Admit: 2016-08-04 | Discharge: 2016-08-09 | DRG: 280 | Disposition: A | Discharge status: Home / Self Care | Payer: Medicare Other | Source: Other Acute Inpatient Hospital | Attending: Internal Medicine | Admitting: Internal Medicine

## 2016-08-04 DIAGNOSIS — Z794 Long term (current) use of insulin: Secondary | ICD-10-CM

## 2016-08-04 DIAGNOSIS — R0600 Dyspnea, unspecified: Secondary | ICD-10-CM | POA: Diagnosis not present

## 2016-08-04 DIAGNOSIS — E7800 Pure hypercholesterolemia, unspecified: Secondary | ICD-10-CM | POA: Diagnosis not present

## 2016-08-04 DIAGNOSIS — N183 Chronic kidney disease, stage 3 unspecified: Secondary | ICD-10-CM | POA: Diagnosis present

## 2016-08-04 DIAGNOSIS — Z87442 Personal history of urinary calculi: Secondary | ICD-10-CM | POA: Diagnosis not present

## 2016-08-04 DIAGNOSIS — E119 Type 2 diabetes mellitus without complications: Secondary | ICD-10-CM

## 2016-08-04 DIAGNOSIS — R7989 Other specified abnormal findings of blood chemistry: Secondary | ICD-10-CM | POA: Diagnosis present

## 2016-08-04 DIAGNOSIS — I519 Heart disease, unspecified: Secondary | ICD-10-CM | POA: Diagnosis not present

## 2016-08-04 DIAGNOSIS — Z8546 Personal history of malignant neoplasm of prostate: Secondary | ICD-10-CM

## 2016-08-04 DIAGNOSIS — Z8249 Family history of ischemic heart disease and other diseases of the circulatory system: Secondary | ICD-10-CM

## 2016-08-04 DIAGNOSIS — I2510 Atherosclerotic heart disease of native coronary artery without angina pectoris: Secondary | ICD-10-CM | POA: Diagnosis present

## 2016-08-04 DIAGNOSIS — I480 Paroxysmal atrial fibrillation: Principal | ICD-10-CM | POA: Diagnosis present

## 2016-08-04 DIAGNOSIS — I25119 Atherosclerotic heart disease of native coronary artery with unspecified angina pectoris: Secondary | ICD-10-CM | POA: Diagnosis present

## 2016-08-04 DIAGNOSIS — Z823 Family history of stroke: Secondary | ICD-10-CM

## 2016-08-04 DIAGNOSIS — Z955 Presence of coronary angioplasty implant and graft: Secondary | ICD-10-CM | POA: Diagnosis not present

## 2016-08-04 DIAGNOSIS — Z79899 Other long term (current) drug therapy: Secondary | ICD-10-CM | POA: Diagnosis not present

## 2016-08-04 DIAGNOSIS — I5023 Acute on chronic systolic (congestive) heart failure: Secondary | ICD-10-CM | POA: Diagnosis present

## 2016-08-04 DIAGNOSIS — I4891 Unspecified atrial fibrillation: Secondary | ICD-10-CM | POA: Diagnosis present

## 2016-08-04 DIAGNOSIS — R9431 Abnormal electrocardiogram [ECG] [EKG]: Secondary | ICD-10-CM | POA: Diagnosis not present

## 2016-08-04 DIAGNOSIS — E088 Diabetes mellitus due to underlying condition with unspecified complications: Secondary | ICD-10-CM

## 2016-08-04 DIAGNOSIS — I25118 Atherosclerotic heart disease of native coronary artery with other forms of angina pectoris: Secondary | ICD-10-CM | POA: Diagnosis not present

## 2016-08-04 DIAGNOSIS — R0609 Other forms of dyspnea: Secondary | ICD-10-CM

## 2016-08-04 DIAGNOSIS — I130 Hypertensive heart and chronic kidney disease with heart failure and stage 1 through stage 4 chronic kidney disease, or unspecified chronic kidney disease: Secondary | ICD-10-CM | POA: Diagnosis present

## 2016-08-04 DIAGNOSIS — R778 Other specified abnormalities of plasma proteins: Secondary | ICD-10-CM | POA: Diagnosis present

## 2016-08-04 DIAGNOSIS — I252 Old myocardial infarction: Secondary | ICD-10-CM

## 2016-08-04 DIAGNOSIS — E1122 Type 2 diabetes mellitus with diabetic chronic kidney disease: Secondary | ICD-10-CM | POA: Diagnosis present

## 2016-08-04 DIAGNOSIS — I10 Essential (primary) hypertension: Secondary | ICD-10-CM

## 2016-08-04 DIAGNOSIS — I255 Ischemic cardiomyopathy: Secondary | ICD-10-CM | POA: Diagnosis present

## 2016-08-04 DIAGNOSIS — I248 Other forms of acute ischemic heart disease: Secondary | ICD-10-CM

## 2016-08-04 DIAGNOSIS — I214 Non-ST elevation (NSTEMI) myocardial infarction: Secondary | ICD-10-CM | POA: Diagnosis not present

## 2016-08-04 DIAGNOSIS — E785 Hyperlipidemia, unspecified: Secondary | ICD-10-CM | POA: Diagnosis not present

## 2016-08-04 DIAGNOSIS — R079 Chest pain, unspecified: Secondary | ICD-10-CM | POA: Insufficient documentation

## 2016-08-04 DIAGNOSIS — I110 Hypertensive heart disease with heart failure: Secondary | ICD-10-CM | POA: Diagnosis present

## 2016-08-04 DIAGNOSIS — R748 Abnormal levels of other serum enzymes: Secondary | ICD-10-CM | POA: Diagnosis not present

## 2016-08-04 HISTORY — DX: Ischemic cardiomyopathy: I25.5

## 2016-08-04 LAB — BASIC METABOLIC PANEL
ANION GAP: 9 (ref 5–15)
BUN: 44 mg/dL — ABNORMAL HIGH (ref 6–20)
CHLORIDE: 114 mmol/L — AB (ref 101–111)
CO2: 19 mmol/L — ABNORMAL LOW (ref 22–32)
Calcium: 9.1 mg/dL (ref 8.9–10.3)
Creatinine, Ser: 2.19 mg/dL — ABNORMAL HIGH (ref 0.61–1.24)
GFR calc Af Amer: 31 mL/min — ABNORMAL LOW (ref >60)
GFR, EST NON AFRICAN AMERICAN: 27 mL/min — AB (ref >60)
GLUCOSE: 147 mg/dL — AB (ref 65–99)
POTASSIUM: 4.5 mmol/L (ref 3.5–5.1)
Sodium: 142 mmol/L (ref 135–145)

## 2016-08-04 LAB — CBC
HEMATOCRIT: 32.6 % — AB (ref 39.0–52.0)
HEMOGLOBIN: 10.7 g/dL — AB (ref 13.0–17.0)
MCH: 27.6 pg (ref 26.0–34.0)
MCHC: 32.8 g/dL (ref 30.0–36.0)
MCV: 84.2 fL (ref 78.0–100.0)
Platelets: 148 10*3/uL — ABNORMAL LOW (ref 150–400)
RBC: 3.87 MIL/uL — ABNORMAL LOW (ref 4.22–5.81)
RDW: 13.8 % (ref 11.5–15.5)
WBC: 6.5 10*3/uL (ref 4.0–10.5)

## 2016-08-04 LAB — GLUCOSE, CAPILLARY
GLUCOSE-CAPILLARY: 138 mg/dL — AB (ref 65–99)
GLUCOSE-CAPILLARY: 87 mg/dL (ref 65–99)
GLUCOSE-CAPILLARY: 162 mg/dL — AB (ref 65–99)
Glucose-Capillary: 147 mg/dL — ABNORMAL HIGH (ref 65–99)

## 2016-08-04 LAB — ECHOCARDIOGRAM COMPLETE
Height: 71 in
WEIGHTICAEL: 2398.4 [oz_av]

## 2016-08-04 LAB — TROPONIN I
Troponin I: 0.59 ng/mL (ref <0.03)
Troponin I: 0.81 ng/mL (ref <0.03)
Troponin I: 0.75 ng/mL (ref <0.03)

## 2016-08-04 LAB — MAGNESIUM: MAGNESIUM: 1.9 mg/dL (ref 1.7–2.4)

## 2016-08-04 LAB — MRSA PCR SCREENING: MRSA BY PCR: NEGATIVE

## 2016-08-04 LAB — TSH: TSH: 2.021 u[IU]/mL (ref 0.350–4.500)

## 2016-08-04 LAB — HEPARIN LEVEL (UNFRACTIONATED): Heparin Unfractionated: 0.98 IU/mL — ABNORMAL HIGH (ref 0.30–0.70)

## 2016-08-04 MED ORDER — METOPROLOL TARTRATE 50 MG PO TABS
50.0000 mg | ORAL_TABLET | Freq: Two times a day (BID) | ORAL | Status: DC
  Administered 2016-08-04: 50 mg via ORAL
  Filled 2016-08-04: qty 1

## 2016-08-04 MED ORDER — INSULIN ASPART 100 UNIT/ML ~~LOC~~ SOLN
0.0000 [IU] | Freq: Three times a day (TID) | SUBCUTANEOUS | Status: DC
  Administered 2016-08-04 – 2016-08-06 (×3): 2 [IU] via SUBCUTANEOUS
  Administered 2016-08-07: 1 [IU] via SUBCUTANEOUS
  Administered 2016-08-07: 3 [IU] via SUBCUTANEOUS
  Administered 2016-08-08: 2 [IU] via SUBCUTANEOUS
  Administered 2016-08-08: 3 [IU] via SUBCUTANEOUS
  Administered 2016-08-08: 1 [IU] via SUBCUTANEOUS
  Administered 2016-08-09: 2 [IU] via SUBCUTANEOUS
  Administered 2016-08-09: 5 [IU] via SUBCUTANEOUS

## 2016-08-04 MED ORDER — DILTIAZEM HCL 100 MG IV SOLR
5.0000 mg/h | INTRAVENOUS | Status: DC
  Filled 2016-08-04: qty 100

## 2016-08-04 MED ORDER — PERFLUTREN LIPID MICROSPHERE
1.0000 mL | INTRAVENOUS | Status: AC | PRN
  Administered 2016-08-04: 2 mL via INTRAVENOUS
  Filled 2016-08-04: qty 10

## 2016-08-04 MED ORDER — ISOSORBIDE MONONITRATE ER 30 MG PO TB24
30.0000 mg | ORAL_TABLET | Freq: Every day | ORAL | Status: DC
  Administered 2016-08-04 – 2016-08-09 (×6): 30 mg via ORAL
  Filled 2016-08-04 (×6): qty 1

## 2016-08-04 MED ORDER — FLUTICASONE PROPIONATE 50 MCG/ACT NA SUSP: 1.0000 | Freq: Every day | NASAL | Status: DC | PRN

## 2016-08-04 MED ORDER — SODIUM CHLORIDE 0.9 % IV SOLN: 250.0000 mL | INTRAVENOUS | Status: DC | PRN

## 2016-08-04 MED ORDER — INSULIN ASPART 100 UNIT/ML ~~LOC~~ SOLN
0.0000 [IU] | Freq: Every day | SUBCUTANEOUS | Status: DC
  Administered 2016-08-07: 2 [IU] via SUBCUTANEOUS

## 2016-08-04 MED ORDER — CO-ENZYME Q10 100 MG PO CAPS: 100.0000 mg | ORAL_CAPSULE | Freq: Every day | ORAL | Status: DC

## 2016-08-04 MED ORDER — METOPROLOL TARTRATE 25 MG PO TABS
25.0000 mg | ORAL_TABLET | Freq: Once | ORAL | Status: AC
  Administered 2016-08-04: 25 mg via ORAL
  Filled 2016-08-04: qty 1

## 2016-08-04 MED ORDER — SODIUM CHLORIDE 0.9% FLUSH
3.0000 mL | Freq: Two times a day (BID) | INTRAVENOUS | Status: DC
  Administered 2016-08-04 – 2016-08-06 (×5): 3 mL via INTRAVENOUS

## 2016-08-04 MED ORDER — ATORVASTATIN CALCIUM 40 MG PO TABS
40.0000 mg | ORAL_TABLET | Freq: Every day | ORAL | Status: DC
  Administered 2016-08-04 – 2016-08-09 (×6): 40 mg via ORAL
  Filled 2016-08-04 (×6): qty 1

## 2016-08-04 MED ORDER — METOPROLOL TARTRATE 25 MG PO TABS
25.0000 mg | ORAL_TABLET | Freq: Two times a day (BID) | ORAL | Status: DC
  Administered 2016-08-04: 25 mg via ORAL
  Filled 2016-08-04: qty 1

## 2016-08-04 MED ORDER — HEPARIN (PORCINE) IN NACL 100-0.45 UNIT/ML-% IJ SOLN
800.0000 [IU]/h | INTRAMUSCULAR | Status: DC
  Administered 2016-08-04: 1000 [IU]/h via INTRAVENOUS
  Administered 2016-08-05: 800 [IU]/h via INTRAVENOUS
  Filled 2016-08-04 (×2): qty 250

## 2016-08-04 MED ORDER — ACETAMINOPHEN 325 MG PO TABS: 650.0000 mg | ORAL_TABLET | ORAL | Status: DC | PRN

## 2016-08-04 MED ORDER — ASPIRIN EC 81 MG PO TBEC
81.0000 mg | DELAYED_RELEASE_TABLET | Freq: Every day | ORAL | Status: DC
  Administered 2016-08-04 – 2016-08-05 (×2): 81 mg via ORAL
  Filled 2016-08-04 (×2): qty 1

## 2016-08-04 MED ORDER — ONDANSETRON HCL 4 MG/2ML IJ SOLN: 4.0000 mg | Freq: Four times a day (QID) | INTRAMUSCULAR | Status: DC | PRN

## 2016-08-04 MED ORDER — HEPARIN BOLUS VIA INFUSION
4000.0000 [IU] | Freq: Once | INTRAVENOUS | Status: AC
Start: 2016-08-04 — End: 2016-08-04
  Administered 2016-08-04: 4000 [IU] via INTRAVENOUS
  Filled 2016-08-04: qty 4000

## 2016-08-04 MED ORDER — SODIUM CHLORIDE 0.9% FLUSH: 3.0000 mL | INTRAVENOUS | Status: DC | PRN

## 2016-08-04 MED ORDER — METOPROLOL TARTRATE 25 MG PO TABS: 25.0000 mg | ORAL_TABLET | Freq: Two times a day (BID) | ORAL | Status: DC

## 2016-08-04 NOTE — H&P (Signed)
History and Physical    Keith Church [id number] DOB: 22-Feb-1935 DOA: 08/04/2016  PCP: [facility] Patient coming from: private residence. Lives in [place] currently in area visiting family/[facility] [place]  Chief Complaint: shortness of breath  HPI: Keith Church is a very pleasant 82 y.o. male with medical history significant for this, hypertension, chronic kidney disease stage III, CAD status post stents July 2017 presents to 3 W. room 7 from [facility] with chief complaint of persistent worsening shortness of breath. Initial evaluation reveals A. fib with rapid ventricular response, elevated troponin, abnormal EKG  Information is obtained from the patient. He states he was in his usual state of health until 2 days ago he developed intermittent shortness of breath with exertion. He denies any fever chills cough lower extremity edema or orthopnea. He states the shortness of breath progressed and worsened. Associated symptoms do include palpitations but he denies chest pain. He denies headache visual disturbances syncope or near-syncope. He denies diaphoresis nausea vomiting. He denies dysuria hematuria frequency or urgency. He reports he is on a "experimental drug" from Dr. Burt Knack his primary cardiologist here in [place].   ED Course: In the emergency department he is in A. fib with rapid ventricular response the heart rate in the 140s. He is provided with therapeutic Lovenox 20 mg a Lasix 20 mg of diltiazem and a diltiazem drip is initiated. At the time of admission his heart rate is 102 he is hypertensive with a blood pressure 158/106 he's afebrile and not hypoxic  Review of Systems: As per HPI otherwise 10 point review of systems negative.   Ambulatory Status: Ambulates independently and dependent with ADLs  Past Medical History:  Diagnosis Date  . Atrial fibrillation with rapid ventricular response ([place])   . CAD (coronary artery disease) 2007   a.  12/2015 DES to LAD b.drug-eluting stent to teeat severe lad stenosis  . Chronic kidney disease (CKD), stage III (moderate)   . HTN (hypertension)   . Hyperlipidemia   . Kidney stones   . Prostate cancer ([place]) ~ 2016  . Type II diabetes mellitus ([place])     Past Surgical History:  Procedure Laterality Date  . CARDIAC CATHETERIZATION  1990s   "no stent"  . CARDIAC CATHETERIZATION N/A 01/05/2016   Procedure: Left Heart Cath and Coronary Angiography;  Surgeon: Sherren Mocha, MD;  Location: [place] [facility];  Service: Cardiovascular;  Laterality: N/A;  . CARDIAC CATHETERIZATION N/A 01/05/2016   Procedure: Coronary Stent Intervention;  Surgeon: Sherren Mocha, MD;  Location: [place] [facility];  Service: Cardiovascular;  Laterality: N/A;  . CATARACT EXTRACTION W/ INTRAOCULAR LENS  IMPLANT, BILATERAL Bilateral   . CORONARY ANGIOPLASTY WITH STENT PLACEMENT  2007   drug-eluting stent to treat severe lad stenosis  . CORONARY ANGIOPLASTY WITH STENT PLACEMENT  01/05/2016  . CYSTOSCOPY W/ STONE MANIPULATION  07/2015  . LAPAROSCOPIC CHOLECYSTECTOMY    . PROSTATE BIOPSY  ~ 2016    Social History   Social History  . Marital status: Married    Spouse name: N/A  . Number of children: N/A  . Years of education: N/A   Occupational History  . retired    Social History Main Topics  . Smoking status: Never Smoker  . Smokeless tobacco: Former Systems developer    Types: Chew     Comment: "quit chewing by the age of 14"  . Alcohol use No  . Drug use: No  . Sexual activity: No  Other Topics Concern  . Not on file   Social History Narrative  . No narrative on file    No Known Allergies  Family History  Problem Relation Age of Onset  . Heart disease Mother   . Stroke Father   . Coronary artery disease      Prior to Admission medications   Medication Sig Start Date End Date Taking? Authorizing Provider  acetaminophen (TYLENOL) 325 MG tablet Take 650 mg by mouth daily as needed for mild pain.     Historical Provider, MD  AMBULATORY NON FORMULARY MEDICATION Take 90 mg by mouth 2 (two) times daily. Medication Name: Brilinta 90 mg BID (TWILIGHT Research study provided) 01/06/16   Burnell Blanks, MD  AMBULATORY NON FORMULARY MEDICATION Take 81 mg by mouth daily. Medication Name: Aspirin 81 mg Daily or PLACEBO (TWILIGHT Research Study PROVIDED) 04/12/16   Burnell Blanks, MD  atorvastatin (LIPITOR) 40 MG tablet Take 1 tablet (40 mg total) by mouth daily. 01/12/16   Almyra Deforest, PA  cetirizine-pseudoephedrine (ZYRTEC-D ALLERGY & CONGESTION) 5-120 MG per tablet Take 1 tablet by mouth daily as needed for allergies.     Historical Provider, MD  Co-Enzyme Q10 100 MG CAPS Take 100 mg by mouth daily.    Historical Provider, MD  epoetin alfa (EPOGEN,PROCRIT) 60454 UNIT/ML injection Inject 10,000 Units into the skin every 6 (six) weeks. If needed.  Patient taking subcutaneous injection once monthly as directed by his Cancer Doctor. Doesn't recall dosage.    Historical Provider, MD  fluticasone (FLONASE) 50 MCG/ACT nasal spray Place 1-2 sprays into both nostrils daily as needed for allergies.  09/30/15   Historical Provider, MD  glipiZIDE (GLUCOTROL) 10 MG tablet Take 10 mg by mouth 2 (two) times daily before a meal.    Historical Provider, MD  insulin detemir (LEVEMIR) 100 UNIT/ML injection Inject 0.35 mLs (35 Units total) into the skin at bedtime. 01/06/16   Cheryln Manly, NP  isosorbide mononitrate (IMDUR) 30 MG 24 hr tablet Take 1 tablet (30 mg total) by mouth daily. 12/01/15   Sherren Mocha, MD  linagliptin (TRADJENTA) 5 MG TABS tablet Take 2.5 mg by mouth daily.     Historical Provider, MD  metoprolol succinate (TOPROL-XL) 50 MG 24 hr tablet Take 1 tablet (50 mg total) by mouth daily. Take with or immediately following a meal. 12/01/15   Sherren Mocha, MD  nitroGLYCERIN (NITROSTAT) 0.4 MG SL tablet Place 1 tablet (0.4 mg total) under the tongue every 5 (five) minutes as needed for chest pain.  01/06/16   Cheryln Manly, NP  vitamin C (ASCORBIC ACID) 500 MG tablet Take 500 mg by mouth daily.    Historical Provider, MD  Vitamin D, Ergocalciferol, (DRISDOL) 50000 units CAPS capsule Take 50,000 Units by mouth every Sunday.  11/28/15   Historical Provider, MD    Physical Exam: Vitals:   08/04/16 [id number] 08/04/16 0758  BP: (!) 158/106   Pulse:  (!) 102  Resp: 18 19  Temp: 97.8 F (36.6 C) 97.6 F (36.4 C)  TempSrc:  Oral  SpO2: 100% 100%  Weight: 68 kg (149 lb 14.4 oz)   Height: 5\' 11"  (1.803 m)      General:  Appears calm and comfortable In no acute distress Eyes:  PERRL, EOMI, normal lids, iris ENT:  grossly normal hearing, lips & tongue, mucous membranes of his mouth are moist and pink Neck:  no LAD, masses or thyromegaly Cardiovascular:  Irregularly irregular, no m/r/g. No LE  edema. Pedal pulses present and palpable Respiratory:  No increased work of breathing with conversation. Breath sounds slightly diminished I hear only fine crackles in bilateral bases no wheeze no rhonchi Abdomen:  soft, ntnd, NABS Skin:  no rash or induration seen on limited exam Musculoskeletal:  grossly normal tone BUE/BLE, good ROM, no bony abnormality, joints without swelling/erythema Psychiatric:  grossly normal mood and affect, speech fluent and appropriate, AOx3 Neurologic:  CN 2-12 grossly intact, moves all extremities in coordinated fashion, sensation intact  Labs on Admission: I have personally reviewed following labs and imaging studies  CBC: No results for input(s): WBC, NEUTROABS, HGB, HCT, MCV, PLT in the last 168 hours. Basic Metabolic Panel: No results for input(s): NA, K, CL, CO2, GLUCOSE, BUN, CREATININE, CALCIUM, MG, PHOS in the last 168 hours. GFR: CrCl cannot be calculated (Patient's most recent lab result is older than the maximum 21 days allowed.). Liver Function Tests: No results for input(s): AST, ALT, ALKPHOS, BILITOT, PROT, ALBUMIN in the last 168 hours. No results  for input(s): LIPASE, AMYLASE in the last 168 hours. No results for input(s): AMMONIA in the last 168 hours. Coagulation Profile: No results for input(s): INR, PROTIME in the last 168 hours. Cardiac Enzymes: No results for input(s): CKTOTAL, CKMB, CKMBINDEX, TROPONINI in the last 168 hours. BNP (last 3 results) No results for input(s): PROBNP in the last 8760 hours. HbA1C: No results for input(s): HGBA1C in the last 72 hours. CBG:  Recent Labs Lab 08/04/16 0806  GLUCAP 138*   Lipid Profile: No results for input(s): CHOL, HDL, LDLCALC, TRIG, CHOLHDL, LDLDIRECT in the last 72 hours. Thyroid Function Tests: No results for input(s): TSH, T4TOTAL, FREET4, T3FREE, THYROIDAB in the last 72 hours. Anemia Panel: No results for input(s): VITAMINB12, FOLATE, FERRITIN, TIBC, IRON, RETICCTPCT in the last 72 hours. Urine analysis: No results found for: COLORURINE, APPEARANCEUR, LABSPEC, PHURINE, GLUCOSEU, HGBUR, BILIRUBINUR, KETONESUR, PROTEINUR, UROBILINOGEN, NITRITE, LEUKOCYTESUR  Creatinine Clearance: CrCl cannot be calculated (Patient's most recent lab result is older than the maximum 21 days allowed.).  Sepsis Labs: @LABRCNTIP (procalcitonin:4,lacticidven:4) )No results found for this or any previous visit (from the past 240 hour(s)).   Radiological Exams on Admission: No results found.  EKG: Independently reviewed. Atrial fibrillation with a competing junctional pacemaker ST & T wave abnormality, consider lateral ischemia  Assessment/Plan Principal Problem:   Atrial fibrillation with rapid ventricular response (HCC) Active Problems:   Diabetes (HCC)   HYPERTENSION, BENIGN   CAD, NATIVE VESSEL   Elevated troponin   Chronic kidney disease (CKD), stage III (moderate)   Ischemic cardiomyopathy   #1. Atrial fibrillation with rapid ventricular response. New-onset. Etiology uncertain. EKG is abnormal with ST and T wave abnormalities initial troponin is elevated 0.23, BNP greater  than 6000 chest x-ray with mild enlargement of cardiomediastinal silhouette mild pulmonary venous congestion no infiltrate no obvious pleural effusion no pneumothorax. chadvasc score 4. He is provided with therapeutic Lovenox 20 mg of diltiazem and a diltiazem drip is initiated. Home medications include metoprolol -Admit to telemetry -Continue diltiazem drip -Resume metoprolol -titrate dilt gtt -Serial troponins -Repeat EKG -Obtain an echo -request cards consult -will need to determine anti-coag needs -Nothing by mouth until evaluated by cardiology  #2. Elevated troponin/abnormal EKG with a history of CAD and cardiomyopathy. Status post stents July 2017. Likely demand in patient with CKD. Denies chest pain. Echo 2017 reveals grade 1 diastolic dysfunction with inability to quantify LVEF per cards note. Cardiology visit in October 2017 reveals patient is enrolled in  twilight trial -cycle troponins -serial ekg -resume imdur -monitor intake and output -Obtain daily weights -Patient was provided with 20 mg Lasix by mouth prior to arrival  3. Hypertension. Poor control. Home medications include metoprolol and imdur -request cards consult -Resume Imdur were and metoprolol -Monitor closely  #4. Chronic kidney disease. Stage III. Baseline creatinine unknown. Creatinine at [place] 198.  -Doubt creatinine on previous facility paperwork correct -We will - avoid Nephrotoxins -Monitor urine output -Recheck in the morning  5. Diabetes. Serum glucose 186 at previous facility. Home medications include Levemir -We'll hold home regimen for now -Patient is nothing by mouth until evaluated by cardiology -Hemoglobin A1c -Siding scale insulin for optimal control   DVT prophylaxis:  lovenox Code Status: full  Family Communication: none present  Disposition Plan: home  Consults called: cardiology  Admission status: inpatient    Radene Gunning MD [facility]  If 7PM-7AM, please  contact night-coverage [email or web address] Password [facility]  08/04/2016, 8:29 AM

## 2016-08-04 NOTE — Progress Notes (Signed)
ANTICOAGULATION CONSULT NOTE - Initial Consult  Pharmacy Consult for heparin Indication: chest pain/ACS and atrial fibrillation  No Known Allergies  Patient Measurements: Height: 5\' 11"  (180.3 cm) Weight: 149 lb 14.4 oz (68 kg) IBW/kg (Calculated) : 75.3  Vital Signs: Temp: 97.6 F (36.4 C) (02/17 0758) Temp Source: Oral (02/17 0758) BP: 111/72 (02/17 1052) Pulse Rate: 68 (02/17 1052)  Labs:  Recent Labs  08/04/16 0748 08/04/16 0844 08/04/16 1032  HGB  --   --  10.7*  HCT  --   --  32.6*  PLT  --   --  148*  CREATININE  --  2.19*  --   TROPONINI 0.59*  --   --     Estimated Creatinine Clearance: 26.3 mL/min (by C-G formula based on SCr of 2.19 mg/dL (H)).   Medical History: Past Medical History:  Diagnosis Date  . Atrial fibrillation with rapid ventricular response ([place])   . CAD (coronary artery disease) 2007   a. 12/2015 DES to LAD b.drug-eluting stent to teeat severe lad stenosis  . Chronic kidney disease (CKD), stage III (moderate)   . HTN (hypertension)   . Hyperlipidemia   . Ischemic cardiomyopathy   . Kidney stones   . Prostate cancer ([place]) ~ 2016  . Type II diabetes mellitus ([place])    Assessment: 45 YOM admitted with dyspnea and found to be in Afib with RVR and NSTEMI. He has hx of CAD s/p DES to LAD,  Troponins elevated 0.59. Likely cath on Monday 2/19. No anticoag on PTA meds -Hgb 10.7, Plts 148 -No S/Sx bleeding  Goal of Therapy:  Heparin level 0.3-0.7 units/ml Monitor platelets by anticoagulation protocol: Yes   Plan:  -Heparin 4000 units x1, then 1000 units/hr -8 hr HL -CBC/HL daily -Monitor S/S bleeding  Keith Church 08/04/2016,11:29 AM

## 2016-08-04 NOTE — Progress Notes (Signed)
PHARMACIST - PHYSICIAN ORDER COMMUNICATION  CONCERNING: P&T Medication Policy on Herbal Medications  DESCRIPTION:  This patient's order for:  Co Enzyme Q10  has been noted.  This product(s) is classified as an "herbal" or natural product. Due to a lack of definitive safety studies or FDA approval, nonstandard manufacturing practices, plus the potential risk of unknown drug-drug interactions while on inpatient medications, the Pharmacy and Therapeutics Committee does not permit the use of "herbal" or natural products of this type within [place].   ACTION TAKEN: The pharmacy department is unable to verify this order at this time and your patient has been informed of this safety policy. Please reevaluate patient's clinical condition at discharge and address if the herbal or natural product(s) should be resumed at that time.   

## 2016-08-04 NOTE — Plan of Care (Signed)
Problem: Activity: Goal: Ability to tolerate increased activity will improve Outcome: Progressing Tolerates ambulation in room. Tachycardic with activity up to 120's - 140's. In and out of Sinus Rhythm.

## 2016-08-04 NOTE — Progress Notes (Signed)
ANTICOAGULATION CONSULT NOTE Pharmacy Consult for heparin Indication: chest pain/ACS and atrial fibrillation  No Known Allergies  Patient Measurements: Height: 5\' 11"  (180.3 cm) Weight: 149 lb 14.4 oz (68 kg) IBW/kg (Calculated) : 75.3  Vital Signs: Temp: 97.8 F (36.6 C) (02/17 1500) Temp Source: Oral (02/17 1500) BP: 114/74 (02/17 1652) Pulse Rate: 59 (02/17 1717)  Labs:  Recent Labs  08/04/16 0748 08/04/16 0844 08/04/16 1032 08/04/16 1314 08/04/16 1925  HGB  --   --  10.7*  --   --   HCT  --   --  32.6*  --   --   PLT  --   --  148*  --   --   HEPARINUNFRC  --   --   --   --  0.98*  CREATININE  --  2.19*  --   --   --   TROPONINI 0.59*  --   --  0.81*  --     Estimated Creatinine Clearance: 26.3 mL/min (by C-G formula based on SCr of 2.19 mg/dL (H)).   Medical History: Past Medical History:  Diagnosis Date  . Atrial fibrillation with rapid ventricular response ([place])   . CAD (coronary artery disease) 2007   a. 12/2015 DES to LAD b.drug-eluting stent to teeat severe lad stenosis  . Chronic kidney disease (CKD), stage III (moderate)   . HTN (hypertension)   . Hyperlipidemia   . Ischemic cardiomyopathy   . Kidney stones   . Prostate cancer ([place]) ~ 2016  . Type II diabetes mellitus ([place])    Assessment: 73 YOM admitted with dyspnea and found to be in Afib with RVR and NSTEMI. He has hx of CAD s/p DES to LAD,  Troponins elevated 0.59. Likely cath on Monday 2/19. No anticoag on PTA meds -Hgb 10.7, Plts 148 -No S/Sx bleeding  Initial heparin level = 0.98   Goal of Therapy:  Heparin level 0.3-0.7 units/ml Monitor platelets by anticoagulation protocol: Yes   Plan:  Decrease heparin to 800 units / hr Follow up AM labs  Thank you Anette Guarneri, PharmD [phone] 08/04/2016,7:52 PM

## 2016-08-04 NOTE — Consult Note (Signed)
CARDIOLOGY CONSULT NOTE   Patient ID: Keith Church MRN: [id number] DOB/AGE: 80-Jul-1936 80 y.o.  Admit date: 08/04/2016  Primary Physician   [facility] Primary Cardiologist   Dr. Burt Knack Reason for Consultation   AFib RVR Requesting Physician  Dr. Aggie Moats  HPI: Keith Church is a 82 y.o. male with a history of CAD s/p DES to LAD, ICM, CKD stage III, HTN, HLD and DM presents for Duration of dyspnea and found to have A. fib with RVR.  He presented in June 2017 with exertional angina, described as right upper chest pressure with walking. He underwent a Myoview stress test which was high-risk. Cath showed critical proximal LAD stenosis and he underwent PCI using a drug-eluting stent.  He was doing well on cardiac stand point when last seen by Dr. Burt Knack 04/13/2016. He has been followed closely by PCP at [facility]. He lives there and comes to visit Dr. Burt Knack as scheduled. Also sees nephrologist for management of CKD.  He is visiting his family here. He has noted few weeks of intermittent dyspnea lasting for few hours with extreme fatigue during this period of time. Episode of severe dyspnea night before last lasting for 2 hours with PND. Patient had a worse episode yesterday leading to further evaluation. Patient has noted recent dyspnea on exertion. Intermittent fluttering sensation. Denies chest pain, palpitation, dizziness, syncope, lower activity edema, melena or blood in his stool or urine. He is enrolled  in [place] study.  EKG shows A. Fib at rate of 88 bpm. More prominent T-wave inversion in lateral lead. Place on IV Cardizem with improved rate to 70s to 110s. Troponin I 0.59. TSH normal. Serum creatinine 2.19  Compliant with medication. Walks 2 miles everyday with recent dyspnea and fatigue.   Past Medical History:  Diagnosis Date  . Atrial fibrillation with rapid ventricular response ([place])   . CAD (coronary artery disease) 2007   a. 12/2015 DES to LAD  b.drug-eluting stent to teeat severe lad stenosis  . Chronic kidney disease (CKD), stage III (moderate)   . HTN (hypertension)   . Hyperlipidemia   . Ischemic cardiomyopathy   . Kidney stones   . Prostate cancer ([place]) ~ 2016  . Type II diabetes mellitus ([place])      Past Surgical History:  Procedure Laterality Date  . CARDIAC CATHETERIZATION  1990s   "no stent"  . CARDIAC CATHETERIZATION N/A 01/05/2016   Procedure: Left Heart Cath and Coronary Angiography;  Surgeon: Sherren Mocha, MD;  Location: [place] [facility];  Service: Cardiovascular;  Laterality: N/A;  . CARDIAC CATHETERIZATION N/A 01/05/2016   Procedure: Coronary Stent Intervention;  Surgeon: Sherren Mocha, MD;  Location: [place] [facility];  Service: Cardiovascular;  Laterality: N/A;  . CATARACT EXTRACTION W/ INTRAOCULAR LENS  IMPLANT, BILATERAL Bilateral   . CORONARY ANGIOPLASTY WITH STENT PLACEMENT  2007   drug-eluting stent to treat severe lad stenosis  . CORONARY ANGIOPLASTY WITH STENT PLACEMENT  01/05/2016  . CYSTOSCOPY W/ STONE MANIPULATION  07/2015  . LAPAROSCOPIC CHOLECYSTECTOMY    . PROSTATE BIOPSY  ~ 2016    No Known Allergies  I have reviewed the patient's current medications . atorvastatin  40 mg Oral Daily  . insulin aspart  0-5 Units Subcutaneous QHS  . insulin aspart  0-9 Units Subcutaneous TID WC  . isosorbide mononitrate  30 mg Oral Daily  . metoprolol tartrate  25 mg Oral BID  . sodium chloride flush  3 mL Intravenous  Q12H   . diltiazem (CARDIZEM) infusion 7.5 mg/hr (08/04/16 0749)   sodium chloride, acetaminophen, fluticasone, ondansetron (ZOFRAN) IV, sodium chloride flush  Prior to Admission medications   Medication Sig Start Date End Date Taking? Authorizing Provider  acetaminophen (TYLENOL) 325 MG tablet Take 650 mg by mouth daily as needed for mild pain.    Historical Provider, MD  AMBULATORY NON FORMULARY MEDICATION Take 90 mg by mouth 2 (two) times daily. Medication Name: Brilinta 90 mg BID  (TWILIGHT Research study provided) 01/06/16   Burnell Blanks, MD  AMBULATORY NON FORMULARY MEDICATION Take 81 mg by mouth daily. Medication Name: Aspirin 81 mg Daily or PLACEBO (TWILIGHT Research Study PROVIDED) 04/12/16   Burnell Blanks, MD  atorvastatin (LIPITOR) 40 MG tablet Take 1 tablet (40 mg total) by mouth daily. 01/12/16   Almyra Deforest, PA  cetirizine-pseudoephedrine (ZYRTEC-D ALLERGY & CONGESTION) 5-120 MG per tablet Take 1 tablet by mouth daily as needed for allergies.     Historical Provider, MD  Co-Enzyme Q10 100 MG CAPS Take 100 mg by mouth daily.    Historical Provider, MD  epoetin alfa (EPOGEN,PROCRIT) 29562 UNIT/ML injection Inject 10,000 Units into the skin every 6 (six) weeks. If needed.  Patient taking subcutaneous injection once monthly as directed by his Cancer Doctor. Doesn't recall dosage.    Historical Provider, MD  fluticasone (FLONASE) 50 MCG/ACT nasal spray Place 1-2 sprays into both nostrils daily as needed for allergies.  09/30/15   Historical Provider, MD  glipiZIDE (GLUCOTROL) 10 MG tablet Take 10 mg by mouth 2 (two) times daily before a meal.    Historical Provider, MD  insulin detemir (LEVEMIR) 100 UNIT/ML injection Inject 0.35 mLs (35 Units total) into the skin at bedtime. 01/06/16   Cheryln Manly, NP  isosorbide mononitrate (IMDUR) 30 MG 24 hr tablet Take 1 tablet (30 mg total) by mouth daily. 12/01/15   Sherren Mocha, MD  linagliptin (TRADJENTA) 5 MG TABS tablet Take 2.5 mg by mouth daily.     Historical Provider, MD  metoprolol succinate (TOPROL-XL) 50 MG 24 hr tablet Take 1 tablet (50 mg total) by mouth daily. Take with or immediately following a meal. 12/01/15   Sherren Mocha, MD  nitroGLYCERIN (NITROSTAT) 0.4 MG SL tablet Place 1 tablet (0.4 mg total) under the tongue every 5 (five) minutes as needed for chest pain. 01/06/16   Cheryln Manly, NP  vitamin C (ASCORBIC ACID) 500 MG tablet Take 500 mg by mouth daily.    Historical Provider, MD  Vitamin  D, Ergocalciferol, (DRISDOL) 50000 units CAPS capsule Take 50,000 Units by mouth every Sunday.  11/28/15   Historical Provider, MD     Social History   Social History  . Marital status: Married    Spouse name: N/A  . Number of children: N/A  . Years of education: N/A   Occupational History  . retired    Social History Main Topics  . Smoking status: Never Smoker  . Smokeless tobacco: Former Systems developer    Types: Chew     Comment: "quit chewing by the age of 75"  . Alcohol use No  . Drug use: No  . Sexual activity: No   Other Topics Concern  . Not on file   Social History Narrative  . No narrative on file    Family Status  Relation Status  . Mother Deceased  . Father Deceased  .     Family History  Problem Relation Age of Onset  .  Heart disease Mother   . Stroke Father   . Coronary artery disease       ROS:  Full 14 point review of systems complete and found to be negative unless listed above.  Physical Exam: Blood pressure (!) 158/106, pulse (!) 102, temperature 97.6 F (36.4 C), temperature source Oral, resp. rate 19, height 5\' 11"  (1.803 m), weight 149 lb 14.4 oz (68 kg), SpO2 100 %.  General: Well developed, well nourished, male in no acute distress Head: Eyes PERRLA, No xanthomas. Normocephalic and atraumatic, oropharynx without edema or exudate.  Lungs: Resp regular and unlabored, CTA. Heart: IR IR  no s3, s4, or murmurs..   Neck: No carotid bruits. No lymphadenopathy. +  JVD. Abdomen: Bowel sounds present, abdomen soft and non-tender without masses or hernias noted. Msk:  No spine or cva tenderness. No weakness, no joint deformities or effusions. Extremities: No clubbing, cyanosis or edema. DP/PT/Radials 2+ and equal bilaterally. Neuro: Alert and oriented X 3. No focal deficits noted. Psych:  Good affect, responds appropriately Skin: No rashes or lesions noted.  Labs:   Lab Results  Component Value Date   WBC 6.5 01/06/2016   HGB 11.0 (L) 01/06/2016   HCT  32.0 (L) 01/06/2016   MCV 84.7 01/06/2016   PLT 134 (L) 01/06/2016   No results for input(s): INR in the last 72 hours.  Recent Labs Lab 08/04/16 0844  NA 142  K 4.5  CL 114*  CO2 19*  BUN 44*  CREATININE 2.19*  CALCIUM 9.1  GLUCOSE 147*   Magnesium  Date Value Ref Range Status  08/04/2016 1.9 1.7 - 2.4 mg/dL Final    Recent Labs  08/04/16 0748  TROPONINI 0.59*   No results for input(s): TROPIPOC in the last 72 hours. No results found for: PROBNP No results found for: CHOL, HDL, LDLCALC, TRIG No results found for: DDIMER No results found for: LIPASE, AMYLASE TSH  Date/Time Value Ref Range Status  08/04/2016 07:48 AM 2.021 0.350 - 4.500 uIU/mL Final    Comment:    Performed by a 3rd Generation assay with a functional sensitivity of <=0.01 uIU/mL.   No results found for: VITAMINB12, FOLATE, FERRITIN, TIBC, IRON, RETICCTPCT  Radiology:  No results found.   CAth 12/2015 Coronary Stent Intervention  Left Heart Cath and Coronary Angiography  Conclusion    Mid RCA lesion, 40% stenosed.  Dist Cx lesion, 90% stenosed.  Mid LAD lesion, 30% stenosed. The lesion was previously treated with a stent (unknown type).  Prox LAD lesion, 95% stenosed. Post intervention, there is a 0% residual stenosis.   1. Severe stenosis of the proximal LAD, treated successfully with PCI using a 3.5 x 12 mm Promus DES 2. Continued patency of the stented segment in the mid LAD with mild diffuse in-stent restenosis 3. Heavily calcified coronary arteries with nonobstructive stenosis in the major portions of the RCA and left circumflex and severe stenosis of the distal left circumflex extending into to the PLA branch  Recommendations: Dual antiplatelet therapy with aspirin and brilinta for 12 months or consideration of the [place] clinical trial. Ongoing risk reduction efforts.    Echo 04/13/16 LV EF: 40% -    45%  ------------------------------------------------------------------- Indications:      CAD (I25.118). Echo performed 04/12/16. LIMITED 2D with Definity.  ------------------------------------------------------------------- Study Conclusions  - Procedure narrative: Transthoracic echocardiography. Image   quality was adequate. Intravenous contrast (Definity) was   administered. - Left ventricle: The cavity size was normal. Wall thickness was  normal. Systolic function was mildly to moderately reduced. The   estimated ejection fraction was in the range of 40% to 45%.   Moderate hypokinesis of the mid-apicalanteroseptal, anterior, and   apical myocardium; consistent with ischemia or infarction in the   distribution of the left anterior descending coronary artery.  ASSESSMENT AND PLAN:     1.  Atrial fibrillation with rapid ventricular response  - New onset. Symptoms started about a week ago. Rate improved to 70s -110 on IV Cardizem. His CHADSVASc score is 5 (age, CHF, HTN, vascular disease and DM). We will start him on a IV heparin for now. Likely transition to Coumadin after ischemic workup done. Not a good candidate for NOAC given CKD. Given LV dysfunction, discontinue IV Cardizem. Increase metoprolol to 50 mg twice a day.  2. NSTEMI with hx of CAD s/p DES to LAD - No chest pain. Troponin I 0.59. Continue cycle troponin.  - EKG this admission showed more pronounced lateral TWI. Ischemic evaluation (cath vs stress test) pending echo. Likely cath on Monday. Start ASA 81mg  qd. - He is enrolled in [place] study. Last cath 12/2015 showed 30% stenosed previously placed unknown stent in mid LAD; 90% dist Cx, and 40% mid RCA. This is treated medically. S/p DES to pro LAD.   3. ICM - Last echo 03/2016 improved LV function to 40-45% from 30-35%. Pending repeat echo this admission.  - AS above. Continue BB and Imdur. He is not on ACE/ARB due to CKD. Likely switch metoprolol to coreg when  rate is stable.   4. CKD, stage III - Followed by nephrologist at [facility]. Scr of 2.19 today. Seem baseline around 1.8. Follow closely.   5. HTN - Minimally elevated. As above.   6. HLD  -Continue statin. Check Lipid panel tomorrow.   7. DM - Per primary.    SignedLeanor Kail, PA 08/04/2016, 9:42 AM Pager [phone]  Co-Sign MD  The patient was seen and examined, and I agree with the history, physical exam, assessment and plan as documented above, with modifications as noted below. Patient with aforementioned history who presented with exertional dyspnea and found to be in new-onset rapid atrial fibrillation. Given LV dysfunction, will attempt to control HR by increasing metoprolol to 50 mg bid and stopping IV diltiazem. More pronounced lateral TWI's when compared to ECG in 12/2015. Initial troponin is elevated. He denies chest pain which was his symptom prior to LAD PCI. Continue to trend. Add IV heparin and ASA, already on statin. Will eventually need warfarin for anticoagulation given CKD. Will repeat echocardiogram to assess for new regional wall motion abnormalities and/or reduction in LVEF (compare to 03/2016 echo). Will then consider coronary angiography.  Kate Sable, MD, [facility]  08/04/2016 10:30 AM

## 2016-08-04 NOTE — Progress Notes (Signed)
  Echocardiogram 2D Echocardiogram with Definity has been performed.  Tresa Res 08/04/2016, 1:07 PM

## 2016-08-05 ENCOUNTER — Encounter (HOSPITAL_COMMUNITY): Payer: Self-pay

## 2016-08-05 DIAGNOSIS — I252 Old myocardial infarction: Secondary | ICD-10-CM

## 2016-08-05 DIAGNOSIS — I519 Heart disease, unspecified: Secondary | ICD-10-CM

## 2016-08-05 DIAGNOSIS — I214 Non-ST elevation (NSTEMI) myocardial infarction: Secondary | ICD-10-CM

## 2016-08-05 LAB — HEMOGLOBIN A1C
Hgb A1c MFr Bld: 7.6 % — ABNORMAL HIGH (ref 4.8–5.6)
MEAN PLASMA GLUCOSE: 171 mg/dL

## 2016-08-05 LAB — GLUCOSE, CAPILLARY
GLUCOSE-CAPILLARY: 112 mg/dL — AB (ref 65–99)
Glucose-Capillary: 82 mg/dL (ref 65–99)
Glucose-Capillary: 102 mg/dL — ABNORMAL HIGH (ref 65–99)
Glucose-Capillary: 154 mg/dL — ABNORMAL HIGH (ref 65–99)

## 2016-08-05 LAB — COMPREHENSIVE METABOLIC PANEL
ALBUMIN: 3.5 g/dL (ref 3.5–5.0)
ALK PHOS: 86 U/L (ref 38–126)
ALT: 16 U/L — AB (ref 17–63)
AST: 19 U/L (ref 15–41)
Anion gap: 9 (ref 5–15)
BILIRUBIN TOTAL: 0.8 mg/dL (ref 0.3–1.2)
BUN: 48 mg/dL — AB (ref 6–20)
CALCIUM: 8.9 mg/dL (ref 8.9–10.3)
CO2: 20 mmol/L — AB (ref 22–32)
CREATININE: 2.37 mg/dL — AB (ref 0.61–1.24)
Chloride: 112 mmol/L — ABNORMAL HIGH (ref 101–111)
GFR calc Af Amer: 28 mL/min — ABNORMAL LOW (ref >60)
GFR calc non Af Amer: 24 mL/min — ABNORMAL LOW (ref >60)
GLUCOSE: 88 mg/dL (ref 65–99)
Potassium: 4.4 mmol/L (ref 3.5–5.1)
SODIUM: 141 mmol/L (ref 135–145)
Total Protein: 6.1 g/dL — ABNORMAL LOW (ref 6.5–8.1)

## 2016-08-05 LAB — CBC
HCT: 32.6 % — ABNORMAL LOW (ref 39.0–52.0)
HEMOGLOBIN: 10.7 g/dL — AB (ref 13.0–17.0)
MCH: 27.9 pg (ref 26.0–34.0)
MCHC: 32.8 g/dL (ref 30.0–36.0)
MCV: 85.1 fL (ref 78.0–100.0)
PLATELETS: 132 10*3/uL — AB (ref 150–400)
RBC: 3.83 MIL/uL — ABNORMAL LOW (ref 4.22–5.81)
RDW: 14 % (ref 11.5–15.5)
WBC: 5.7 10*3/uL (ref 4.0–10.5)

## 2016-08-05 LAB — LIPID PANEL
CHOLESTEROL: 106 mg/dL (ref 0–200)
HDL: 38 mg/dL — ABNORMAL LOW (ref >40)
LDL Cholesterol: 50 mg/dL (ref 0–99)
Total CHOL/HDL Ratio: 2.8 RATIO
Triglycerides: 88 mg/dL (ref <150)
VLDL: 18 mg/dL (ref 0–40)

## 2016-08-05 LAB — HEPARIN LEVEL (UNFRACTIONATED): HEPARIN UNFRACTIONATED: 0.56 [IU]/mL (ref 0.30–0.70)

## 2016-08-05 MED ORDER — SODIUM CHLORIDE 0.9% FLUSH: 3.0000 mL | Freq: Two times a day (BID) | INTRAVENOUS | Status: DC

## 2016-08-05 MED ORDER — SODIUM CHLORIDE 0.9% FLUSH
3.0000 mL | INTRAVENOUS | Status: DC | PRN
Start: 2016-08-05 — End: 2016-08-06

## 2016-08-05 MED ORDER — SODIUM CHLORIDE 0.9 % IV SOLN: 250.0000 mL | INTRAVENOUS | Status: DC | PRN

## 2016-08-05 MED ORDER — SODIUM CHLORIDE 0.9 % IV SOLN
INTRAVENOUS | Status: DC
  Administered 2016-08-06: 10 mL/h via INTRAVENOUS

## 2016-08-05 MED ORDER — METOPROLOL SUCCINATE ER 50 MG PO TB24
50.0000 mg | ORAL_TABLET | Freq: Every day | ORAL | Status: DC
  Administered 2016-08-05 – 2016-08-09 (×6): 50 mg via ORAL
  Filled 2016-08-05 (×5): qty 1

## 2016-08-05 MED ORDER — ASPIRIN 81 MG PO CHEW
81.0000 mg | CHEWABLE_TABLET | ORAL | Status: AC
Start: 2016-08-06 — End: 2016-08-06
  Administered 2016-08-06: 81 mg via ORAL
  Filled 2016-08-05: qty 1

## 2016-08-05 NOTE — Progress Notes (Signed)
ANTICOAGULATION CONSULT NOTE Pharmacy Consult for heparin Indication: chest pain/ACS and atrial fibrillation  No Known Allergies  Patient Measurements: Height: 5\' 11"  (180.3 cm) Weight: 149 lb 12.8 oz (67.9 kg) IBW/kg (Calculated) : 75.3  Vital Signs: Temp: 97.8 F (36.6 C) (02/18 0439) BP: 128/76 (02/18 0553) Pulse Rate: 59 (02/18 0553)  Labs:  Recent Labs  08/04/16 0748 08/04/16 0844 08/04/16 1032 08/04/16 1314 08/04/16 1843 08/04/16 1925 08/05/16 0418  HGB  --   --  10.7*  --   --   --  10.7*  HCT  --   --  32.6*  --   --   --  32.6*  PLT  --   --  148*  --   --   --  132*  HEPARINUNFRC  --   --   --   --   --  0.98* 0.56  CREATININE  --  2.19*  --   --   --   --  2.37*  TROPONINI 0.59*  --   --  0.81* 0.75*  --   --     Estimated Creatinine Clearance: 24.3 mL/min (by C-G formula based on SCr of 2.37 mg/dL (H)).   Medical History: Past Medical History:  Diagnosis Date  . Atrial fibrillation with rapid ventricular response ([place])   . CAD (coronary artery disease) 2007   a. 12/2015 DES to LAD b.drug-eluting stent to teeat severe lad stenosis  . Chronic kidney disease (CKD), stage III (moderate)   . HTN (hypertension)   . Hyperlipidemia   . Ischemic cardiomyopathy   . Kidney stones   . Prostate cancer ([place]) ~ 2016  . Type II diabetes mellitus ([place])    Assessment: 38 YOM admitted with dyspnea and found to be in Afib with RVR and NSTEMI. He has hx of CAD s/p DES to LAD,  Troponins elevated 0.81 >> 0.75. Likely cath on Monday 2/19. No anticoag on PTA meds -Hgb 10.7, Plts 148 >> 132 -HL 0.56, therapeutic -No S/Sx bleeding  Goal of Therapy:  Heparin level 0.3-0.7 units/ml Monitor platelets by anticoagulation protocol: Yes   Plan:  -Continue heparin at 800 units / hr -CBC/HL daily -Monitor S/S bleeding  Myer Peer Grayland Ormond), PharmD  [id number] Pharmacy Resident Pager: [phone] 08/05/2016 8:37 AM

## 2016-08-05 NOTE — Progress Notes (Signed)
PROGRESS NOTE    Keith Church  [id number] DOB: [birth year] DOA: 08/04/2016 PCP: [facility]   Brief Narrative: 82 y.o. male with medical history significant for this, hypertension, chronic kidney disease stage III, CAD status post stents July 2017 presented with chief complaint of persistent worsening shortness of breath. Initial evaluation reveals A. fib with rapid ventricular response, elevated troponin, abnormal EKG.  Assessment & Plan:  #  Atrial fibrillation with rapid ventricular response: This is new onset. -Patient initially required IV Cardizem and later switched to oral metoprolol. Today, switched to Toprol-XL by cardiologist. Continue to monitor heart rate. Patient is on IV heparin for systemic anticoagulation. -Needs to transition to oral anticoagulant  after cardiac workup.  # NSTEMI with history of coronary artery disease status post stent: Patient with elevated troponin level of 0.8 with EKG changes, pronounced lateral T wave inversion. As per cardiologist, patient needs coronary angiography this week. -Echocardiogram showed severely reduced EF of 20-25% compared to last echo in October 2017 which showed EF of 40-45%. -Patient does not have chest pain today. -Continue cardiac medications including Toprol XL, Imdur. Not in ACEI/ARB due to chronic kidney disease -Continue aspirin, statin  #Ischemic cardiomyopathy with no sign of heart failure: Continue cardiac medication and workup as above.  #Chronic kidney disease is stage III: Patient reported that his baseline serum creatinine level is somewhat from 2.5-3. Current serum creatinine level is around baseline. Continue to monitor serum creatinine level. Patient with good urine output. Avoid nephrotoxins. Admitted to monitor kidney function closely if patient gets contrast during cardiac cath. -I discussed with the cardiologist today.  #Essential hypertension: Monitor blood pressure. Continue current  cardiac medications.  #Dyslipidemia: Continue statin.  #Type 2 diabetes: Monitor blood sugar level. Continue current sliding scale insulin. Avoid hypoglycemia episode.   Principal Problem:   Atrial fibrillation with rapid ventricular response (HCC) Active Problems:   Diabetes (HCC)   HYPERTENSION, BENIGN   CAD, NATIVE VESSEL   Elevated troponin   Chronic kidney disease (CKD), stage III (moderate)   Ischemic cardiomyopathy   Abnormal EKG  DVT prophylaxis: On IV heparin Code Status: Full code Family Communication: No family present at bedside Disposition Plan: Likely discharge home in 2-3 days  Consultants:   Cardiologist   Procedures: Echo  Antimicrobials: None  Subjective: Patient was seen and examined at bedside. Patient reported feeling better today. Denied headache, dizziness, nausea, vomiting, chest pain or shortness of breath. No abdominal pain.  Objective: Vitals:   08/04/16 2118 08/05/16 0029 08/05/16 0439 08/05/16 0553  BP:  96/63 114/66 128/76  Pulse: 74 76 67 (!) 59  Resp:  17 15 12   Temp:  97.7 F (36.5 C) 97.8 F (36.6 C)   TempSrc:      SpO2:  97% 99% 98%  Weight:   67.9 kg (149 lb 12.8 oz)   Height:        Intake/Output Summary (Last 24 hours) at 08/05/16 1146 Last data filed at 08/05/16 0600  Gross per 24 hour  Intake           762.06 ml  Output             1200 ml  Net          -437.94 ml   Filed Weights   08/04/16 0646 08/05/16 0439  Weight: 68 kg (149 lb 14.4 oz) 67.9 kg (149 lb 12.8 oz)    Examination:  General exam: Appears calm and comfortable  Respiratory  system: Clear to auscultation. Respiratory effort normal. No wheezing or crackle Cardiovascular system: S1 & S2 heard, Irregular rhythm, regular heart rate.  No pedal edema. Gastrointestinal system: Abdomen is nondistended, soft and nontender. Normal bowel sounds heard. Central nervous system: Alert and oriented. No focal neurological deficits. Extremities: Symmetric 5 x 5  power. Skin: No rashes, lesions or ulcers Psychiatry: Judgement and insight appear normal. Mood & affect appropriate.     Data Reviewed: I have personally reviewed following labs and imaging studies  CBC:  Recent Labs Lab 08/04/16 1032 08/05/16 0418  WBC 6.5 5.7  HGB 10.7* 10.7*  HCT 32.6* 32.6*  MCV 84.2 85.1  PLT 148* [id number]*   Basic Metabolic Panel:  Recent Labs Lab 08/04/16 0748 08/04/16 0844 08/05/16 0418  NA  --  142 141  K  --  4.5 4.4  CL  --  114* 112*  CO2  --  19* 20*  GLUCOSE  --  147* 88  BUN  --  44* 48*  CREATININE  --  2.19* 2.37*  CALCIUM  --  9.1 8.9  MG 1.9  --   --    GFR: Estimated Creatinine Clearance: 24.3 mL/min (by C-G formula based on SCr of 2.37 mg/dL (H)). Liver Function Tests:  Recent Labs Lab 08/05/16 0418  AST 19  ALT 16*  ALKPHOS 86  BILITOT 0.8  PROT 6.1*  ALBUMIN 3.5   No results for input(s): LIPASE, AMYLASE in the last 168 hours. No results for input(s): AMMONIA in the last 168 hours. Coagulation Profile: No results for input(s): INR, PROTIME in the last 168 hours. Cardiac Enzymes:  Recent Labs Lab 08/04/16 0748 08/04/16 1314 08/04/16 1843  TROPONINI 0.59* 0.81* 0.75*   BNP (last 3 results) No results for input(s): PROBNP in the last 8760 hours. HbA1C: No results for input(s): HGBA1C in the last 72 hours. CBG:  Recent Labs Lab 08/04/16 1201 08/04/16 1639 08/04/16 2100 08/05/16 0732 08/05/16 1126  GLUCAP 87 162* 147* 82 102*   Lipid Profile:  Recent Labs  08/05/16 0418  CHOL 106  HDL 38*  LDLCALC 50  TRIG 88  CHOLHDL 2.8   Thyroid Function Tests:  Recent Labs  08/04/16 0748  TSH 2.021   Anemia Panel: No results for input(s): VITAMINB12, FOLATE, FERRITIN, TIBC, IRON, RETICCTPCT in the last 72 hours. Sepsis Labs: No results for input(s): PROCALCITON, LATICACIDVEN in the last 168 hours.  Recent Results (from the past 240 hour(s))  MRSA PCR Screening     Status: None   Collection Time:  08/04/16  7:00 AM  Result Value Ref Range Status   MRSA by PCR NEGATIVE NEGATIVE Final    Comment:        The GeneXpert MRSA Assay (FDA approved for NASAL specimens only), is one component of a comprehensive MRSA colonization surveillance program. It is not intended to diagnose MRSA infection nor to guide or monitor treatment for MRSA infections.          Radiology Studies: No results found.      Scheduled Meds: . aspirin EC  81 mg Oral Daily  . atorvastatin  40 mg Oral Daily  . insulin aspart  0-5 Units Subcutaneous QHS  . insulin aspart  0-9 Units Subcutaneous TID WC  . isosorbide mononitrate  30 mg Oral Daily  . metoprolol succinate  50 mg Oral Daily  . sodium chloride flush  3 mL Intravenous Q12H   Continuous Infusions: . heparin 800 Units/hr (08/05/16 1035)  LOS: 1 day    Dron Tanna Furry, MD [facility] Pager [phone]  If 7PM-7AM, please contact night-coverage [email or web address] Password [facility] 08/05/2016, 11:46 AM

## 2016-08-05 NOTE — Progress Notes (Signed)
Progress Note  Patient Name: Keith Church Date of Encounter: 08/05/2016  Primary Cardiologist: Burt Knack  Subjective   Denies chest pain, shortness of breath, leg swelling, and palpitations.  LVEF now 20-25%, previously 40-45% in 03/2016.  Inpatient Medications    Scheduled Meds: . aspirin EC  81 mg Oral Daily  . atorvastatin  40 mg Oral Daily  . insulin aspart  0-5 Units Subcutaneous QHS  . insulin aspart  0-9 Units Subcutaneous TID WC  . isosorbide mononitrate  30 mg Oral Daily  . metoprolol tartrate  50 mg Oral BID  . sodium chloride flush  3 mL Intravenous Q12H   Continuous Infusions: . heparin 800 Units/hr (08/04/16 1952)   PRN Meds: sodium chloride, acetaminophen, fluticasone, ondansetron (ZOFRAN) IV, sodium chloride flush   Vital Signs    Vitals:   08/04/16 2118 08/05/16 0029 08/05/16 0439 08/05/16 0553  BP:  96/63 114/66 128/76  Pulse: 74 76 67 (!) 59  Resp:  17 15 12   Temp:  97.7 F (36.5 C) 97.8 F (36.6 C)   TempSrc:      SpO2:  97% 99% 98%  Weight:   149 lb 12.8 oz (67.9 kg)   Height:        Intake/Output Summary (Last 24 hours) at 08/05/16 0751 Last data filed at 08/05/16 0600  Gross per 24 hour  Intake           762.06 ml  Output             1700 ml  Net          -937.94 ml   Filed Weights   08/04/16 0646 08/05/16 0439  Weight: 149 lb 14.4 oz (68 kg) 149 lb 12.8 oz (67.9 kg)    Telemetry    NSR with frequent PVC's and occasional PAC's - Personally Reviewed  ECG     - Personally Reviewed  Physical Exam   GEN: No acute distress.   Neck: No JVD Cardiac: Regular rate and irregular rhythm with frequent premature contractions, no murmurs, rubs, or gallops.  Respiratory: Clear to auscultation bilaterally. GI: Soft, nontender, non-distended  MS: No edema; No deformity. Neuro:  Nonfocal  Psych: Normal affect   Labs    Chemistry Recent Labs Lab 08/04/16 0844 08/05/16 0418  NA 142 141  K 4.5 4.4  CL 114* 112*  CO2 19* 20*    GLUCOSE 147* 88  BUN 44* 48*  CREATININE 2.19* 2.37*  CALCIUM 9.1 8.9  PROT  --  6.1*  ALBUMIN  --  3.5  AST  --  19  ALT  --  16*  ALKPHOS  --  86  BILITOT  --  0.8  GFRNONAA 27* 24*  GFRAA 31* 28*  ANIONGAP 9 9     Hematology Recent Labs Lab 08/04/16 1032 08/05/16 0418  WBC 6.5 5.7  RBC 3.87* 3.83*  HGB 10.7* 10.7*  HCT 32.6* 32.6*  MCV 84.2 85.1  MCH 27.6 27.9  MCHC 32.8 32.8  RDW 13.8 14.0  PLT 148* 132*    Cardiac Enzymes Recent Labs Lab 08/04/16 0748 08/04/16 1314 08/04/16 1843  TROPONINI 0.59* 0.81* 0.75*   No results for input(s): TROPIPOC in the last 168 hours.   BNPNo results for input(s): BNP, PROBNP in the last 168 hours.   DDimer No results for input(s): DDIMER in the last 168 hours.   Radiology    No results found.  Cardiac Studies   Echocardiogram (08/04/16):  - Left ventricle:  The cavity size was normal. Wall thickness was   normal. Systolic function was severely reduced. The estimated   ejection fraction was in the range of 20% to 25%. Diffuse   hypokinesis. Features are consistent with a pseudonormal left   ventricular filling pattern, with concomitant abnormal relaxation   and increased filling pressure (grade 2 diastolic dysfunction).   E/medial e&' > 15 suggesting LV end diastolic pressure at least 20   mmHg. - Aortic valve: There was no stenosis. - Mitral valve: There was mild regurgitation. - Left atrium: The atrium was mildly to moderately dilated. - Right ventricle: The cavity size was normal. Systolic function   was mildly reduced. - Tricuspid valve: Peak RV-RA gradient (S): 30 mm Hg. - Pulmonary arteries: PA peak pressure: 38 mm Hg (S). - Systemic veins: IVC measured 2.0 cm with < 50% respirophasic   variation, suggesting RA pressure 8 mmHg.  Impressions:  - Normal LV size with EF 20-25%, diffuse hypokinesis. Moderate   diastolic dysfunction with evidence for elevated LV filling   pressure. Normal RV size with  mildly decreased systolic function.   Mild mitral regurgitation. Mild pulmonary hypertension.  Patient Profile     82 y.o. male with CAD and ischemic cardiomyopathy presented with exertional dyspnea and found to be in new-onset rapid atrial fibrillation.  Assessment & Plan    1.  Atrial fibrillation with rapid ventricular response  - New onset. Symptoms started about a week ago. Initially placed on IV Cardizem, switched to increase dose of oral metoprolol yesterday (will switch to Toprol-XL given cardiomyopathy). His CHADSVASc score is 36 (age, CHF, HTN, vascular disease and DM). Continue IV heparin for now as he will likely require coronary angiography this week given precipitous decline in LV systolic function. Likely transition to Coumadin after ischemic workup done. Not a good candidate for NOAC given CKD.   2. NSTEMI with hx of CAD s/p DES to LAD - No chest pain. Troponin peaked at 0.81.  -Continue IV heparin for now as he will likely require coronary angiography this week given precipitous decline in LV systolic function. High risk for contrast-induced nephropathy. - EKG this admission showed more pronounced lateral TWI. Ischemic evaluation - Continue ASA, beta blocker (will switch to Toprol-XL given cardiomyopathy), and statin. - He is enrolled in [place] study. Last cath 12/2015 showed 30% stenosed previously placed unknown stent in mid LAD; 90% dist Cx, and 40% mid RCA. This is treated medically. S/p DES to pro LAD.   3. ICM - Last echo 03/2016 improved LV function to 40-45%, now down to 20-25%. - No signs of heart failure  - Continue BB (will switch to Toprol-XL given cardiomyopathy) and Imdur. He is not on ACE/ARB due to CKD.   4. CKD, stage III - Followed by [facility]rologist [facility]. Scr of 2.37 today. Seem baseline around 1.8. Follow closely.   5. HTN - Controlled. As above.   6. HLD  -Continue statin. LDL 50.   Signed, Kate Sable, MD  08/05/2016, 7:51 AM

## 2016-08-06 ENCOUNTER — Encounter (HOSPITAL_COMMUNITY)
Admission: AD | Disposition: A | Discharge status: Home / Self Care | Payer: Self-pay | Source: Other Acute Inpatient Hospital | Attending: Nephrology

## 2016-08-06 ENCOUNTER — Encounter (HOSPITAL_COMMUNITY): Payer: Self-pay | Admitting: Cardiology

## 2016-08-06 DIAGNOSIS — I2510 Atherosclerotic heart disease of native coronary artery without angina pectoris: Secondary | ICD-10-CM

## 2016-08-06 HISTORY — PX: LEFT HEART CATH AND CORONARY ANGIOGRAPHY: CATH118249

## 2016-08-06 LAB — BASIC METABOLIC PANEL
Anion gap: 10 (ref 5–15)
BUN: 50 mg/dL — AB (ref 6–20)
CALCIUM: 9 mg/dL (ref 8.9–10.3)
CO2: 21 mmol/L — ABNORMAL LOW (ref 22–32)
Chloride: 110 mmol/L (ref 101–111)
Creatinine, Ser: 2.22 mg/dL — ABNORMAL HIGH (ref 0.61–1.24)
GFR calc Af Amer: 31 mL/min — ABNORMAL LOW (ref >60)
GFR, EST NON AFRICAN AMERICAN: 26 mL/min — AB (ref >60)
Glucose, Bld: 122 mg/dL — ABNORMAL HIGH (ref 65–99)
Potassium: 4.7 mmol/L (ref 3.5–5.1)
SODIUM: 141 mmol/L (ref 135–145)

## 2016-08-06 LAB — GLUCOSE, CAPILLARY
GLUCOSE-CAPILLARY: 115 mg/dL — AB (ref 65–99)
GLUCOSE-CAPILLARY: 156 mg/dL — AB (ref 65–99)
Glucose-Capillary: 111 mg/dL — ABNORMAL HIGH (ref 65–99)
Glucose-Capillary: 178 mg/dL — ABNORMAL HIGH (ref 65–99)

## 2016-08-06 LAB — CBC
HCT: 33.4 % — ABNORMAL LOW (ref 39.0–52.0)
Hemoglobin: 11.1 g/dL — ABNORMAL LOW (ref 13.0–17.0)
MCH: 28.2 pg (ref 26.0–34.0)
MCHC: 33.2 g/dL (ref 30.0–36.0)
MCV: 84.8 fL (ref 78.0–100.0)
PLATELETS: 138 10*3/uL — AB (ref 150–400)
RBC: 3.94 MIL/uL — ABNORMAL LOW (ref 4.22–5.81)
RDW: 13.8 % (ref 11.5–15.5)
WBC: 4.7 10*3/uL (ref 4.0–10.5)

## 2016-08-06 LAB — PROTIME-INR
INR: 1.06
PROTHROMBIN TIME: 13.8 s (ref 11.4–15.2)

## 2016-08-06 LAB — HEPARIN LEVEL (UNFRACTIONATED): Heparin Unfractionated: 0.3 IU/mL (ref 0.30–0.70)

## 2016-08-06 SURGERY — LEFT HEART CATH AND CORONARY ANGIOGRAPHY
Anesthesia: LOCAL

## 2016-08-06 MED ORDER — SODIUM CHLORIDE 0.9% FLUSH
3.0000 mL | Freq: Two times a day (BID) | INTRAVENOUS | Status: DC
  Administered 2016-08-09: 3 mL via INTRAVENOUS

## 2016-08-06 MED ORDER — MIDAZOLAM HCL 2 MG/2ML IJ SOLN
INTRAMUSCULAR | Status: DC | PRN
  Administered 2016-08-06: 1 mg via INTRAVENOUS

## 2016-08-06 MED ORDER — LIDOCAINE HCL (PF) 1 % IJ SOLN
INTRAMUSCULAR | Status: AC
  Filled 2016-08-06: qty 30

## 2016-08-06 MED ORDER — WARFARIN - PHARMACIST DOSING INPATIENT: Freq: Every day | Status: DC

## 2016-08-06 MED ORDER — HEPARIN (PORCINE) IN NACL 2-0.9 UNIT/ML-% IJ SOLN
INTRAMUSCULAR | Status: AC
  Filled 2016-08-06: qty 1000

## 2016-08-06 MED ORDER — VERAPAMIL HCL 2.5 MG/ML IV SOLN
INTRAVENOUS | Status: DC | PRN
  Administered 2016-08-06: 10 mL via INTRA_ARTERIAL

## 2016-08-06 MED ORDER — FENTANYL CITRATE (PF) 100 MCG/2ML IJ SOLN
INTRAMUSCULAR | Status: DC | PRN
  Administered 2016-08-06: 25 ug via INTRAVENOUS

## 2016-08-06 MED ORDER — HEPARIN SODIUM (PORCINE) 1000 UNIT/ML IJ SOLN
INTRAMUSCULAR | Status: DC | PRN
Start: 2016-08-06 — End: 2016-08-06
  Administered 2016-08-06: 4500 [IU] via INTRAVENOUS

## 2016-08-06 MED ORDER — HEPARIN (PORCINE) IN NACL 100-0.45 UNIT/ML-% IJ SOLN
1050.0000 [IU]/h | INTRAMUSCULAR | Status: DC
  Administered 2016-08-06: 900 [IU]/h via INTRAVENOUS
  Filled 2016-08-06: qty 250

## 2016-08-06 MED ORDER — SODIUM CHLORIDE 0.9 % WEIGHT BASED INFUSION
1.0000 mL/kg/h | INTRAVENOUS | Status: AC
  Administered 2016-08-06: 1 mL/kg/h via INTRAVENOUS

## 2016-08-06 MED ORDER — SODIUM CHLORIDE 0.9 % IV SOLN: 250.0000 mL | INTRAVENOUS | Status: DC | PRN

## 2016-08-06 MED ORDER — VERAPAMIL HCL 2.5 MG/ML IV SOLN
INTRAVENOUS | Status: AC
  Filled 2016-08-06: qty 2

## 2016-08-06 MED ORDER — HEPARIN SODIUM (PORCINE) 1000 UNIT/ML IJ SOLN
INTRAMUSCULAR | Status: AC
  Filled 2016-08-06: qty 1

## 2016-08-06 MED ORDER — IOPAMIDOL (ISOVUE-370) INJECTION 76%
INTRAVENOUS | Status: AC
  Filled 2016-08-06: qty 100

## 2016-08-06 MED ORDER — IOPAMIDOL (ISOVUE-370) INJECTION 76%
INTRAVENOUS | Status: DC | PRN
  Administered 2016-08-06: 50 mL via INTRA_ARTERIAL

## 2016-08-06 MED ORDER — WARFARIN VIDEO
Freq: Once | Status: AC
  Administered 2016-08-06: 18:00:00

## 2016-08-06 MED ORDER — SODIUM CHLORIDE 0.9% FLUSH
3.0000 mL | INTRAVENOUS | Status: DC | PRN
Start: 2016-08-06 — End: 2016-08-09

## 2016-08-06 MED ORDER — MIDAZOLAM HCL 2 MG/2ML IJ SOLN
INTRAMUSCULAR | Status: AC
  Filled 2016-08-06: qty 2

## 2016-08-06 MED ORDER — LIDOCAINE HCL (PF) 1 % IJ SOLN
INTRAMUSCULAR | Status: DC | PRN
  Administered 2016-08-06: 2 mL via INTRADERMAL

## 2016-08-06 MED ORDER — HEPARIN (PORCINE) IN NACL 2-0.9 UNIT/ML-% IJ SOLN
INTRAMUSCULAR | Status: DC | PRN
  Administered 2016-08-06: 1000 mL

## 2016-08-06 MED ORDER — PATIENT'S GUIDE TO USING COUMADIN BOOK
Freq: Once | Status: DC
  Filled 2016-08-06: qty 1

## 2016-08-06 MED ORDER — WARFARIN SODIUM 7.5 MG PO TABS
7.5000 mg | ORAL_TABLET | Freq: Once | ORAL | Status: AC
Start: 2016-08-06 — End: 2016-08-06
  Administered 2016-08-06: 7.5 mg via ORAL
  Filled 2016-08-06: qty 1

## 2016-08-06 MED ORDER — FENTANYL CITRATE (PF) 100 MCG/2ML IJ SOLN
INTRAMUSCULAR | Status: AC
  Filled 2016-08-06: qty 2

## 2016-08-06 SURGICAL SUPPLY — 8 items
CATH 5FR JL3.5 JR4 ANG PIG MP (CATHETERS) ×2 IMPLANT
GLIDESHEATH SLEND SS 6F .021 (SHEATH) ×2 IMPLANT
GUIDEWIRE INQWIRE 1.5J.035X260 (WIRE) ×1 IMPLANT
INQWIRE 1.5J .035X260CM (WIRE) ×2
KIT HEART LEFT (KITS) ×2 IMPLANT
PACK CARDIAC CATHETERIZATION (CUSTOM PROCEDURE TRAY) ×2 IMPLANT
TRANSDUCER W/STOPCOCK (MISCELLANEOUS) ×2 IMPLANT
TUBING CIL FLEX 10 FLL-RA (TUBING) ×2 IMPLANT

## 2016-08-06 NOTE — Interval H&P Note (Signed)
History and Physical Interval Note:  08/06/2016 9:09 AM  Keith Church  has presented today for surgery, with the diagnosis of afib  The various methods of treatment have been discussed with the patient and family. After consideration of risks, benefits and other options for treatment, the patient has consented to  Procedure(s): Left Heart Cath and Coronary Angiography (N/A) as a surgical intervention .  The patient's history has been reviewed, patient examined, no change in status, stable for surgery.  I have reviewed the patient's chart and labs.  Questions were answered to the patient's satisfaction.    Cath Lab Visit (complete for each Cath Lab visit)  Clinical Evaluation Leading to the Procedure:   ACS: Yes.    Non-ACS:    Anginal Classification: CCS III  Anti-ischemic medical therapy: Maximal Therapy (2 or more classes of medications)  Non-Invasive Test Results: No non-invasive testing performed  Prior CABG: No previous CABG       Collier Salina [facility] 08/06/2016 9:09 AM

## 2016-08-06 NOTE — H&P (View-Only) (Signed)
Progress Note  Patient Name: Keith Church Date of Encounter: 08/05/2016  Primary Cardiologist: Burt Knack  Subjective   Denies chest pain, shortness of breath, leg swelling, and palpitations.  LVEF now 20-25%, previously 40-45% in 03/2016.  Inpatient Medications    Scheduled Meds: . aspirin EC  81 mg Oral Daily  . atorvastatin  40 mg Oral Daily  . insulin aspart  0-5 Units Subcutaneous QHS  . insulin aspart  0-9 Units Subcutaneous TID WC  . isosorbide mononitrate  30 mg Oral Daily  . metoprolol tartrate  50 mg Oral BID  . sodium chloride flush  3 mL Intravenous Q12H   Continuous Infusions: . heparin 800 Units/hr (08/04/16 1952)   PRN Meds: sodium chloride, acetaminophen, fluticasone, ondansetron (ZOFRAN) IV, sodium chloride flush   Vital Signs    Vitals:   08/04/16 2118 08/05/16 0029 08/05/16 0439 08/05/16 0553  BP:  96/63 114/66 128/76  Pulse: 74 76 67 (!) 59  Resp:  17 15 12   Temp:  97.7 F (36.5 C) 97.8 F (36.6 C)   TempSrc:      SpO2:  97% 99% 98%  Weight:   149 lb 12.8 oz (67.9 kg)   Height:        Intake/Output Summary (Last 24 hours) at 08/05/16 0751 Last data filed at 08/05/16 0600  Gross per 24 hour  Intake           762.06 ml  Output             1700 ml  Net          -937.94 ml   Filed Weights   08/04/16 0646 08/05/16 0439  Weight: 149 lb 14.4 oz (68 kg) 149 lb 12.8 oz (67.9 kg)    Telemetry    NSR with frequent PVC's and occasional PAC's - Personally Reviewed  ECG     - Personally Reviewed  Physical Exam   GEN: No acute distress.   Neck: No JVD Cardiac: Regular rate and irregular rhythm with frequent premature contractions, no murmurs, rubs, or gallops.  Respiratory: Clear to auscultation bilaterally. GI: Soft, nontender, non-distended  MS: No edema; No deformity. Neuro:  Nonfocal  Psych: Normal affect   Labs    Chemistry Recent Labs Lab 08/04/16 0844 08/05/16 0418  NA 142 141  K 4.5 4.4  CL 114* 112*  CO2 19* 20*    GLUCOSE 147* 88  BUN 44* 48*  CREATININE 2.19* 2.37*  CALCIUM 9.1 8.9  PROT  --  6.1*  ALBUMIN  --  3.5  AST  --  19  ALT  --  16*  ALKPHOS  --  86  BILITOT  --  0.8  GFRNONAA 27* 24*  GFRAA 31* 28*  ANIONGAP 9 9     Hematology Recent Labs Lab 08/04/16 1032 08/05/16 0418  WBC 6.5 5.7  RBC 3.87* 3.83*  HGB 10.7* 10.7*  HCT 32.6* 32.6*  MCV 84.2 85.1  MCH 27.6 27.9  MCHC 32.8 32.8  RDW 13.8 14.0  PLT 148* 132*    Cardiac Enzymes Recent Labs Lab 08/04/16 0748 08/04/16 1314 08/04/16 1843  TROPONINI 0.59* 0.81* 0.75*   No results for input(s): TROPIPOC in the last 168 hours.   BNPNo results for input(s): BNP, PROBNP in the last 168 hours.   DDimer No results for input(s): DDIMER in the last 168 hours.   Radiology    No results found.  Cardiac Studies   Echocardiogram (08/04/16):  - Left ventricle:  The cavity size was normal. Wall thickness was   normal. Systolic function was severely reduced. The estimated   ejection fraction was in the range of 20% to 25%. Diffuse   hypokinesis. Features are consistent with a pseudonormal left   ventricular filling pattern, with concomitant abnormal relaxation   and increased filling pressure (grade 2 diastolic dysfunction).   E/medial e&' > 15 suggesting LV end diastolic pressure at least 20   mmHg. - Aortic valve: There was no stenosis. - Mitral valve: There was mild regurgitation. - Left atrium: The atrium was mildly to moderately dilated. - Right ventricle: The cavity size was normal. Systolic function   was mildly reduced. - Tricuspid valve: Peak RV-RA gradient (S): 30 mm Hg. - Pulmonary arteries: PA peak pressure: 38 mm Hg (S). - Systemic veins: IVC measured 2.0 cm with < 50% respirophasic   variation, suggesting RA pressure 8 mmHg.  Impressions:  - Normal LV size with EF 20-25%, diffuse hypokinesis. Moderate   diastolic dysfunction with evidence for elevated LV filling   pressure. Normal RV size with  mildly decreased systolic function.   Mild mitral regurgitation. Mild pulmonary hypertension.  Patient Profile     82 y.o. male with CAD and ischemic cardiomyopathy presented with exertional dyspnea and found to be in new-onset rapid atrial fibrillation.  Assessment & Plan    1.  Atrial fibrillation with rapid ventricular response  - New onset. Symptoms started about a week ago. Initially placed on IV Cardizem, switched to increase dose of oral metoprolol yesterday (will switch to Toprol-XL given cardiomyopathy). His CHADSVASc score is 65 (age, CHF, HTN, vascular disease and DM). Continue IV heparin for now as he will likely require coronary angiography this week given precipitous decline in LV systolic function. Likely transition to Coumadin after ischemic workup done. Not a good candidate for NOAC given CKD.   2. NSTEMI with hx of CAD s/p DES to LAD - No chest pain. Troponin peaked at 0.81.  -Continue IV heparin for now as he will likely require coronary angiography this week given precipitous decline in LV systolic function. High risk for contrast-induced nephropathy. - EKG this admission showed more pronounced lateral TWI. Ischemic evaluation - Continue ASA, beta blocker (will switch to Toprol-XL given cardiomyopathy), and statin. - He is enrolled in [place] study. Last cath 12/2015 showed 30% stenosed previously placed unknown stent in mid LAD; 90% dist Cx, and 40% mid RCA. This is treated medically. S/p DES to pro LAD.   3. ICM - Last echo 03/2016 improved LV function to 40-45%, now down to 20-25%. - No signs of heart failure  - Continue BB (will switch to Toprol-XL given cardiomyopathy) and Imdur. He is not on ACE/ARB due to CKD.   4. CKD, stage III - Followed by nephrologist at [facility]. Scr of 2.37 today. Seem baseline around 1.8. Follow closely.   5. HTN - Controlled. As above.   6. HLD  -Continue statin. LDL 50.   Signed, Kate Sable, MD  08/05/2016, 7:51 AM

## 2016-08-06 NOTE — Progress Notes (Signed)
ANTICOAGULATION CONSULT NOTE - Follow Up Consult  Pharmacy Consult for Heparin and Coumadin Indication: atrial fibrillation  No Known Allergies  Patient Measurements: Height: 5\' 11"  (180.3 cm) Weight: 189 lb 4.8 oz (85.9 kg) IBW/kg (Calculated) : 75.3 Heparin Dosing Weight: 85.9 kg  Vital Signs: Temp: 97.9 F (36.6 C) (02/19 0515) BP: 111/70 (02/19 0936) Pulse Rate: 84 (02/19 0936)  Labs:  Recent Labs  08/04/16 0748 08/04/16 0844  08/04/16 1032 08/04/16 1314 08/04/16 1843 08/04/16 1925 08/05/16 0418 08/06/16 0421  HGB  --   --   < > 10.7*  --   --   --  10.7* 11.1*  HCT  --   --   --  32.6*  --   --   --  32.6* 33.4*  PLT  --   --   --  148*  --   --   --  132* 138*  LABPROT  --   --   --   --   --   --   --   --  13.8  INR  --   --   --   --   --   --   --   --  1.06  HEPARINUNFRC  --   --   --   --   --   --  0.98* 0.56 0.30  CREATININE  --  2.19*  --   --   --   --   --  2.37* 2.22*  TROPONINI 0.59*  --   --   --  0.81* 0.75*  --   --   --   < > = values in this interval not displayed.  Estimated Creatinine Clearance: 28.7 mL/min (by C-G formula based on SCr of 2.22 mg/dL (H)).  Assessment: 64 YOM admitted with dyspnea and found to be in Afib with RVR and NSTEMI. He has hx of CAD s/p DES to LAD. Not on anticoagulants prior to admission.    S/p cardiac cath.  Heparin to resume 8 hrs after sheath out.  Coumadin to begin today.   Heparin level was low therapeutic (0.30) this morning on 800 units/hr.   TR band off ~1:30pm. No bleeding or hematoma.       On TWILGIHT study meds at home, not currently getting as inpatient. Patient and wife report that they have a small supply with them for travel in 14-day pill box; currently in [place].   Goal of Therapy:  INR 2-3 Heparin level 0.3-0.7 units/ml Monitor platelets by anticoagulation protocol: Yes   Plan:   Resume heparin drip ~9:30pm at 900 units/hr.  Coumadin to begin with 7.5mg  x 1 tonight.  Daily heparin  level, PT/INR and CBC.  On TWILIGHT study meds at home, but not as inpatient. Patient and wife report MD aware, and expect to resume after discharge.  Coumadin education completed.  Arty Baumgartner, [place] Pager: [phone] 08/06/2016,2:42 PM

## 2016-08-06 NOTE — Discharge Instructions (Signed)

## 2016-08-06 NOTE — Progress Notes (Signed)
PROGRESS NOTE    Keith Church  [id number] DOB: January 12, 1935 DOA: 08/04/2016 PCP: [facility]   Brief Narrative: 82 y.o. male with medical history significant for this, hypertension, chronic kidney disease stage III, CAD status post stents July 2017 presented with chief complaint of persistent worsening shortness of breath. Initial evaluation reveals A. fib with rapid ventricular response, elevated troponin, abnormal EKG.  Assessment & Plan:  #  Atrial fibrillation with rapid ventricular response: This is new onset. -Patient initially required IV Cardizem and later switched to oral metoprolol XL. Tolerating well. Continue to monitor heart rate. Patient is on IV heparin for systemic anticoagulation. -Needs to transition to oral anticoagulant  after cardiac workup.  # NSTEMI with history of coronary artery disease status post stent: Patient with elevated troponin level of 0.8 with EKG changes, pronounced lateral T wave inversion. -Planned for cardiac catheterization today. Cardiac consult appreciated. -Echocardiogram showed severely reduced EF of 20-25% compared to last echo in October 2017 which showed EF of 40-45%. -Patient does not have chest pain today and reported feeling good. -Continue cardiac medications including Toprol XL, Imdur. Not in ACEI/ARB due to chronic kidney disease -Continue aspirin, statin  #Ischemic cardiomyopathy with no sign of heart failure: Continue cardiac medication and workup as above.  #Chronic kidney disease is stage III: Patient reported that his baseline serum creatinine level is somewhat from 2.5-3. Current serum creatinine level is around baseline, and a creatinine of 2.2 today. Continue to monitor serum creatinine level. Patient with good urine output. Avoid nephrotoxins. -Discussed with the patient and his wife at bedside about increased risk of worsening kidney function with IV contrast during cardiac catheterization. They verbalized  understanding. Continue to monitor.  #Essential hypertension: Monitor blood pressure. Continue current cardiac medications.  #Dyslipidemia: Continue statin.  #Type 2 diabetes: Monitor blood sugar level. Continue current sliding scale insulin. Avoid hypoglycemia episode.   Principal Problem:   Atrial fibrillation with rapid ventricular response (HCC) Active Problems:   Diabetes (HCC)   HYPERTENSION, BENIGN   CAD, NATIVE VESSEL   Elevated troponin   Chronic kidney disease (CKD), stage III (moderate)   Ischemic cardiomyopathy   Abnormal EKG   NSTEMI (non-ST elevated myocardial infarction) (HCC)  DVT prophylaxis: On IV heparin Code Status: Full code Family Communication: No family present at bedside Disposition Plan: Likely discharge home in 1-2 days  Consultants:   Cardiologist   Procedures: Echo  Antimicrobials: None  Subjective: Patient was seen and examined at bedside. Patient reported feeling good. Denied nausea vomiting chest pain or shortness of breath. Wife at bedside.  Objective: Vitals:   08/06/16 0921 08/06/16 0926 08/06/16 0931 08/06/16 0936  BP: 120/73 119/66 110/71 111/70  Pulse: (!) 58 68 64 84  Resp: 20 13 19 17   Temp:      TempSrc:      SpO2: 99% 95% 96% 96%  Weight:      Height:        Intake/Output Summary (Last 24 hours) at 08/06/16 0956 Last data filed at 08/06/16 0842  Gross per 24 hour  Intake           327.06 ml  Output             1400 ml  Net         -1072.94 ml   Filed Weights   08/04/16 0646 08/05/16 0439 08/06/16 0515  Weight: 86.1 kg (189 lb 14.4 oz) 85.9 kg (189 lb 4.8 oz) 85.9 kg (189  lb 4.8 oz)    Examination:  General exam: Not in distress, lying on bed comfortable  Respiratory system: Clear bilateral. Respiratory effort normal. No wheezing or crackle Cardiovascular system: S1 & S2 heard, regular rhythm, regular heart rate.  No pedal edema. Gastrointestinal system: Abdomen is nondistended, soft and nontender. Normal bowel  sounds heard. Central nervous system: Alert and oriented. No focal neurological deficits. Extremities: Symmetric 5 x 5 power. Skin: No rashes, lesions or ulcers Psychiatry: Judgement and insight appear normal. Mood & affect appropriate.     Data Reviewed: I have personally reviewed following labs and imaging studies  CBC:  Recent Labs Lab 08/04/16 1032 08/05/16 0418 08/06/16 0421  WBC 6.5 5.7 4.7  HGB 10.7* 10.7* 11.1*  HCT 32.6* 32.6* 33.4*  MCV 84.2 85.1 84.8  PLT 148* 132* 0000000*   Basic Metabolic Panel:  Recent Labs Lab 08/04/16 0748 08/04/16 0844 08/05/16 0418 08/06/16 0421  NA  --  142 141 141  K  --  4.5 4.4 4.7  CL  --  114* 112* 110  CO2  --  19* 20* 21*  GLUCOSE  --  147* 88 122*  BUN  --  44* 48* 50*  CREATININE  --  2.19* 2.37* 2.22*  CALCIUM  --  9.1 8.9 9.0  MG 1.9  --   --   --    GFR: Estimated Creatinine Clearance: 28.7 mL/min (by C-G formula based on SCr of 2.22 mg/dL (H)). Liver Function Tests:  Recent Labs Lab 08/05/16 0418  AST 19  ALT 16*  ALKPHOS 86  BILITOT 0.8  PROT 6.1*  ALBUMIN 3.5   No results for input(s): LIPASE, AMYLASE in the last 168 hours. No results for input(s): AMMONIA in the last 168 hours. Coagulation Profile:  Recent Labs Lab 08/06/16 0421  INR 1.06   Cardiac Enzymes:  Recent Labs Lab 08/04/16 0748 08/04/16 1314 08/04/16 1843  TROPONINI 0.59* 0.81* 0.75*   BNP (last 3 results) No results for input(s): PROBNP in the last 8760 hours. HbA1C:  Recent Labs  08/04/16 0748  HGBA1C 7.6*   CBG:  Recent Labs Lab 08/05/16 0732 08/05/16 1126 08/05/16 1611 08/05/16 2134 08/06/16 0721  GLUCAP 82 102* 154* 112* 115*   Lipid Profile:  Recent Labs  08/05/16 0418  CHOL 106  HDL 38*  LDLCALC 50  TRIG 88  CHOLHDL 2.8   Thyroid Function Tests:  Recent Labs  08/04/16 0748  TSH 2.021   Anemia Panel: No results for input(s): VITAMINB12, FOLATE, FERRITIN, TIBC, IRON, RETICCTPCT in the last 72  hours. Sepsis Labs: No results for input(s): PROCALCITON, LATICACIDVEN in the last 168 hours.  Recent Results (from the past 240 hour(s))  MRSA PCR Screening     Status: None   Collection Time: 08/04/16  7:00 AM  Result Value Ref Range Status   MRSA by PCR NEGATIVE NEGATIVE Final    Comment:        The GeneXpert MRSA Assay (FDA approved for NASAL specimens only), is one component of a comprehensive MRSA colonization surveillance program. It is not intended to diagnose MRSA infection nor to guide or monitor treatment for MRSA infections.          Radiology Studies: No results found.      Scheduled Meds: . [MAR Hold] aspirin EC  81 mg Oral Daily  . [MAR Hold] atorvastatin  40 mg Oral Daily  . [MAR Hold] insulin aspart  0-5 Units Subcutaneous QHS  . [MAR Hold] insulin aspart  0-9 Units Subcutaneous TID WC  . [MAR Hold] isosorbide mononitrate  30 mg Oral Daily  . [MAR Hold] metoprolol succinate  50 mg Oral Daily  . [MAR Hold] sodium chloride flush  3 mL Intravenous Q12H  . sodium chloride flush  3 mL Intravenous Q12H   Continuous Infusions: . sodium chloride 10 mL/hr (08/06/16 0605)  . heparin 800 Units/hr (08/05/16 1035)     LOS: 2 days    Dron Tanna Furry, MD [facility] Pager [phone]  If 7PM-7AM, please contact night-coverage [email or web address] Password [facility] 08/06/2016, 9:56 AM

## 2016-08-06 NOTE — Progress Notes (Addendum)
   Patient is for cath this AM. There is significant renal dysfunction. This was noted in yesterday's precatheterization note. Creatinine today is 2.2. This is slightly better than yesterday and seems to be stable this admission. It has been mentioned that there is need for careful ongoing attention to renal function.  Daryel November, MD

## 2016-08-07 ENCOUNTER — Encounter (HOSPITAL_COMMUNITY): Payer: Self-pay

## 2016-08-07 DIAGNOSIS — R748 Abnormal levels of other serum enzymes: Secondary | ICD-10-CM

## 2016-08-07 LAB — CBC
HEMATOCRIT: 31 % — AB (ref 39.0–52.0)
HEMOGLOBIN: 10.3 g/dL — AB (ref 13.0–17.0)
MCH: 28.1 pg (ref 26.0–34.0)
MCHC: 33.2 g/dL (ref 30.0–36.0)
MCV: 84.5 fL (ref 78.0–100.0)
PLATELETS: 146 10*3/uL — AB (ref 150–400)
RBC: 3.67 MIL/uL — AB (ref 4.22–5.81)
RDW: 13.9 % (ref 11.5–15.5)
WBC: 4.9 10*3/uL (ref 4.0–10.5)

## 2016-08-07 LAB — BASIC METABOLIC PANEL
ANION GAP: 9 (ref 5–15)
BUN: 45 mg/dL — ABNORMAL HIGH (ref 6–20)
CHLORIDE: 112 mmol/L — AB (ref 101–111)
CO2: 18 mmol/L — AB (ref 22–32)
Calcium: 8.7 mg/dL — ABNORMAL LOW (ref 8.9–10.3)
Creatinine, Ser: 1.93 mg/dL — ABNORMAL HIGH (ref 0.61–1.24)
GFR calc non Af Amer: 31 mL/min — ABNORMAL LOW (ref >60)
GFR, EST AFRICAN AMERICAN: 36 mL/min — AB (ref >60)
Glucose, Bld: 143 mg/dL — ABNORMAL HIGH (ref 65–99)
POTASSIUM: 4.7 mmol/L (ref 3.5–5.1)
SODIUM: 139 mmol/L (ref 135–145)

## 2016-08-07 LAB — GLUCOSE, CAPILLARY
GLUCOSE-CAPILLARY: 124 mg/dL — AB (ref 65–99)
GLUCOSE-CAPILLARY: 210 mg/dL — AB (ref 65–99)
Glucose-Capillary: 203 mg/dL — ABNORMAL HIGH (ref 65–99)

## 2016-08-07 LAB — PROTIME-INR
INR: 1.11
Prothrombin Time: 14.4 seconds (ref 11.4–15.2)

## 2016-08-07 LAB — HEPARIN LEVEL (UNFRACTIONATED)
HEPARIN UNFRACTIONATED: 0.34 [IU]/mL (ref 0.30–0.70)
Heparin Unfractionated: 0.17 IU/mL — ABNORMAL LOW (ref 0.30–0.70)

## 2016-08-07 MED ORDER — AMIODARONE HCL 200 MG PO TABS
400.0000 mg | ORAL_TABLET | Freq: Every day | ORAL | Status: DC
  Administered 2016-08-07 – 2016-08-08 (×2): 400 mg via ORAL
  Filled 2016-08-07 (×2): qty 2

## 2016-08-07 MED ORDER — CLOPIDOGREL BISULFATE 75 MG PO TABS: 75.0000 mg | ORAL_TABLET | Freq: Every day | ORAL | 0 refills | Status: DC

## 2016-08-07 MED ORDER — WARFARIN SODIUM 5 MG PO TABS: 5.0000 mg | ORAL_TABLET | Freq: Once | ORAL | 0 refills | Status: DC

## 2016-08-07 MED ORDER — CLOPIDOGREL BISULFATE 75 MG PO TABS
75.0000 mg | ORAL_TABLET | Freq: Every day | ORAL | Status: DC
  Administered 2016-08-07 – 2016-08-09 (×3): 75 mg via ORAL
  Filled 2016-08-07 (×4): qty 1

## 2016-08-07 MED ORDER — HEPARIN (PORCINE) IN NACL 100-0.45 UNIT/ML-% IJ SOLN
1050.0000 [IU]/h | INTRAMUSCULAR | Status: DC
  Administered 2016-08-07 – 2016-08-09 (×2): 1050 [IU]/h via INTRAVENOUS
  Filled 2016-08-07 (×2): qty 250

## 2016-08-07 MED ORDER — ENOXAPARIN SODIUM 80 MG/0.8ML ~~LOC~~ SOLN: 80.0000 mg | SUBCUTANEOUS | 0 refills | Status: DC

## 2016-08-07 MED ORDER — INSULIN DETEMIR 100 UNIT/ML ~~LOC~~ SOLN: 22.0000 [IU] | Freq: Two times a day (BID) | SUBCUTANEOUS | Status: AC

## 2016-08-07 MED ORDER — AMIODARONE IV BOLUS ONLY 150 MG/100ML
150.0000 mg | Freq: Once | INTRAVENOUS | Status: AC
  Administered 2016-08-07: 150 mg via INTRAVENOUS
  Filled 2016-08-07: qty 100

## 2016-08-07 MED ORDER — ENOXAPARIN SODIUM 80 MG/0.8ML ~~LOC~~ SOLN: 80.0000 mg | SUBCUTANEOUS | Status: DC

## 2016-08-07 MED ORDER — WARFARIN SODIUM 7.5 MG PO TABS
7.5000 mg | ORAL_TABLET | Freq: Once | ORAL | Status: AC
  Administered 2016-08-07: 7.5 mg via ORAL
  Filled 2016-08-07: qty 1

## 2016-08-07 NOTE — Research (Signed)
Research aware of new diagnosis of Afib and starting coumadin. I will educate patient about stopping Brilinta and ASA/PLACEBO Provided as part of the TWILIGHT Research study protocol  and patient will remain in follow up via phone but not medication treatment due to Coumadin need.

## 2016-08-07 NOTE — Care Management Note (Addendum)
Case Management Note  Patient Details  Name: HENLEY WAHEED MRN: [id number] Date of Birth: 06/28/1934  Subjective/Objective: Pt presented for Atrial Fib and SOB-CHF. Post cardiac cath in the twilight research study protocol. Pt is from [place] near [facility] and plans to return. Pt has PCP in [place] at [facility]. PCP is Dr. Jinger Neighbors.                  Action/Plan: No needs identified for [facility] needs at this time of visit. Will continue to monitor.   Expected Discharge Date:                  Expected Discharge Plan:  Home/Self Care  In-House Referral:  NA  Discharge planning Services  CM Consult  Post Acute Care Choice:  NA Choice offered to:  NA  DME Arranged:  N/A DME Agency:  NA  HH Arranged:  NA HH Agency:  NA  Status of Service:  Completed, signed off  If discussed at [place] of Stay Meetings, dates discussed:    Additional Comments: 1226 08-07-16 Jacqlyn Krauss, [place] [phone]  Benefits Check Completed: Will make pt aware of cost. Pt uses [facility] on [address]- medication is available. No further needs from CM at this time.   [facility] [place] @  [facility] # [id number]. LOVENOX 80 MG SQ DAILY FOR 5 DAYS  COVER- YES  CO-PAY- $ 164.47  TIER- 3 DRUG  PRIOR APPROVAL- NO    2.ENOXAPARIN  80 MG SQ DAILY FOR 5 DAYS  COVER- YES  C-PAY- $ 10.00  TIER- 1 DRUG  PRIOR APPROVAL- NO   [facility]Y : [facility]    1053 08-07-16 Jacqlyn Krauss, RN,BSN [phone] CM has Benefits check in process for Lovenox. CM will make pt aware of cost. Cardiology to establish patient with INR checks for coumadin. Appointment placed on the AVS for [place]. Pt aware to make Hospital F/u as well.  No further needs from CM at this time.  Bethena Roys, RN 08/07/2016, 10:33 AM

## 2016-08-07 NOTE — Discharge Summary (Addendum)
Physician Discharge Summary  Keith Church [id number] DOB: 1935-03-14 DOA: 08/04/2016  PCP: [facility] date: 08/04/2016 Discharge date: 08/07/2016  Admitted From:Home Disposition:home  Recommendations for Outpatient Follow-up:  1. Follow up with PCP in 1-2 weeks 2. Please obtain BMP/CBC in one week 3. Please check INR on 2/23 with your PCP and adjust the coumadin dose.   Home Health:no Equipment/Devices:no Discharge Condition:stable CODE STATUS:full Diet recommendation:Carb modified heart healthy diet.  Brief/Interim Summary:82 y.o.malewith medical history significant for this, hypertension, chronic kidney disease stage III, CAD status post stents July 2017 presented with chief complaint of persistent worsening shortness of breath. Initial evaluation reveals A. fib with rapid ventricular response, elevated troponin, abnormal EKG.  #  Atrial fibrillation with rapid ventricular response:  -Patient with new onset atrial fibrillation. Initially required IV Cardizem and later switched to oral metoprolol XL.  -Heart rate is better controlled. Converted to sinus rhythm. Initially treated with IV heparin and later switched to oral Coumadin. I discussed with Dr. Burt Knack from cardiologist recommended that patient can go home today with Coumadin and Lovenox. The dose of Lovenox adjusted according to patient's GFR. Dr. Burt Knack arranged for outpatient follow-up of PT/INR checkup with patient's PCP on 2/23.  -I discussed with the pharmacist regarding discharge Coumadin and Lovenox. Patient will be discharged with Coumadin 5 mg daily and Lovenox 80 mg every 24 hour onto INR is therapeutic. Patient was educated about to monitor any sign of bleeding or bruises. He'll be followed up by cardiologist and PCP. Patient is medically stable.  # NSTEMI with history of coronary artery disease status post stent: Patient with elevated troponin level of 0.8 with EKG changes, pronounced  lateral T wave inversion. -Cardiac cath was done. It showed patent LAD stent and stable coronary anatomy. Patient has no chest pain and he is clinically stable. -Echocardiogram showed severely reduced EF of 20-25% compared to last echo in October 2017 which showed EF of 40-45%. -Continue cardiac medications including Toprol XL, Imdur. Not in ACEI/ARB due to chronic kidney disease -Continue statin and added Plavix by cardiologist.  #Ischemic cardiomyopathy with no sign of heart failure: Continue cardiac medication and workup as above.  #Chronic kidney disease is stage III: Patient reported that his baseline serum creatinine level is somewhat from 2.5-3. Patient's serum creatinine level is 1.9 today. Recommended outpatient follow-up.  #Essential hypertension: Monitor blood pressure. Continue current cardiac medications.  #Dyslipidemia: Continue statin.  #Type 2 diabetes: Monitor blood sugar level. Continue home medications. Recommended to monitor blood sugar level and follow-up with PCP.  Patient is clinically improved. He is eager to go home today. Cardiologist recommended to discharge patient home with Coumadin and outpatient follow-up. I discussed with Dr. Burt Knack, case manager, patient's nurse and pharmacist regarding the discharge plan. Patient verbalized understanding of follow-up instructions and plan of care. At this time patient is medically stable to transfer his care to outpatient.  Addendum 12:47 PM  Received call from cardiologist Dr Burt Knack. Pt now with Afib. Plan to bolus with IV amiodarone and start 400 mg twice from tomorrow. Discharge for today canceled. Need to reassess patient in the morning tomorrow and medication reconciliation.  Discharge Diagnoses:  Principal Problem:   Atrial fibrillation with rapid ventricular response (HCC) Active Problems:   Diabetes (HCC)   HYPERTENSION, BENIGN   CAD, NATIVE VESSEL   Elevated troponin   Chronic kidney disease (CKD), stage  III (moderate)   Ischemic cardiomyopathy   Abnormal EKG   NSTEMI (  non-ST elevated myocardial infarction) [facility])    Discharge Instructions  Discharge Instructions    Call MD for:  difficulty breathing, headache or visual disturbances    Complete by:  As directed    Call MD for:  extreme fatigue    Complete by:  As directed    Call MD for:  hives    Complete by:  As directed    Call MD for:  persistant dizziness or light-headedness    Complete by:  As directed    Call MD for:  persistant nausea and vomiting    Complete by:  As directed    Call MD for:  severe uncontrolled pain    Complete by:  As directed    Call MD for:  temperature >100.4    Complete by:  As directed    Diet - low sodium heart healthy    Complete by:  As directed    Diet Carb Modified    Complete by:  As directed    Discharge instructions    Complete by:  As directed    Please check your INR with PCP and adjust the dose. Watch for any sign of bleeding. If you see any sign of bleeding or bruises please talk to your doctor.   Increase activity slowly    Complete by:  As directed      Allergies as of 08/07/2016   No Known Allergies     Medication List    STOP taking these medications   AMBULATORY NON FORMULARY MEDICATION     TAKE these medications   acetaminophen 325 MG tablet Commonly known as:  TYLENOL Take 325-650 mg by mouth daily as needed for mild pain.   atorvastatin 40 MG tablet Commonly known as:  LIPITOR Take 1 tablet (40 mg total) by mouth daily.   cetirizine 10 MG tablet Commonly known as:  ZYRTEC Take 10 mg by mouth daily as needed for allergies.   clopidogrel 75 MG tablet Commonly known as:  PLAVIX Take 1 tablet (75 mg total) by mouth daily.   co-enzyme Q-10 50 MG capsule Take 50 mg by mouth daily.   enoxaparin 80 MG/0.8ML injection Commonly known as:  LOVENOX Inject 0.8 mLs (80 mg total) into the skin daily. Discontinue when INR >24 hours.   epoetin alfa 10000 UNIT/ML  injection Commonly known as:  EPOGEN,PROCRIT Inject 10,000 Units into the skin every 6 (six) weeks. If needed.  Patient taking subcutaneous injection once monthly as directed by his Cancer Doctor. Doesn't recall dosage.   fluticasone 50 MCG/ACT nasal spray Commonly known as:  FLONASE Place 1-2 sprays into both nostrils daily as needed for allergies.   glipiZIDE 10 MG tablet Commonly known as:  GLUCOTROL Take 10 mg by mouth 2 (two) times daily before a meal.   insulin detemir 100 UNIT/ML injection Commonly known as:  LEVEMIR Inject 0.22 mLs (22 Units total) into the skin 2 (two) times daily.   isosorbide mononitrate 30 MG 24 hr tablet Commonly known as:  IMDUR Take 1 tablet (30 mg total) by mouth daily.   linagliptin 5 MG Tabs tablet Commonly known as:  TRADJENTA Take 5 mg by mouth daily.   metoprolol succinate 50 MG 24 hr tablet Commonly known as:  TOPROL-XL Take 1 tablet (50 mg total) by mouth daily. Take with or immediately following a meal.   nitroGLYCERIN 0.4 MG SL tablet Commonly known as:  NITROSTAT Place 1 tablet (0.4 mg total) under the tongue every 5 (five) minutes  as needed for chest pain.   simethicone 125 MG chewable tablet Commonly known as:  MYLICON Chew 0000000 mg by mouth every 6 (six) hours as needed for flatulence.   vitamin C 500 MG tablet Commonly known as:  ASCORBIC ACID Take 500 mg by mouth daily.   Vitamin D (Ergocalciferol) 50000 units Caps capsule Commonly known as:  DRISDOL Take 50,000 Units by mouth every Sunday.   warfarin 5 MG tablet Commonly known as:  COUMADIN Take 1 tablet (5 mg total) by mouth one time only at 6 PM. Please adjust the dose depending on INR result, follow up with your PCP.      Follow-up Information    [facility] Go on 08/10/2016.   Specialty:  Family Medicine Why:  @ 11am for PT/INR Contact information: [address] [place]  [place] [address] [phone]        Richardson Dopp, PA-C Follow up  on 08/21/2016.   Specialties:  Cardiology, Physician Assistant Why:  @ 3:45pm Contact information: [id number] [place] [address] [phone]          No Known Allergies  Consultations: Cardiologist  Procedures/Studies: Cardiac cath Echocardiogram  Subjective: Patient was seen and examined at bedside. He reported feeling good. Denied headache, dizziness, nausea, vomiting, chest pain, shortness of breath. No abdominal pain. Patient's wife at bedside.  Discharge Exam: Vitals:   08/07/16 0439 08/07/16 0741  BP: 134/81 132/89  Pulse: 65 64  Resp: 12   Temp: 97.8 F (36.6 C) 97.8 F (36.6 C)   Vitals:   08/06/16 2019 08/07/16 0025 08/07/16 0439 08/07/16 0741  BP: 128/72 133/79 134/81 132/89  Pulse:  71 65 64  Resp: 19 15 12    Temp: 97.8 F (36.6 C) 97.8 F (36.6 C) 97.8 F (36.6 C) 97.8 F (36.6 C)  TempSrc:    Oral  SpO2: 98% 96% 98% 99%  Weight:   86.2 kg (190 lb 1.6 oz)   Height:        General: Pt is alert, awake, not in acute distress Cardiovascular: RRR, S1/S2 +, no rubs, no gallops Respiratory: CTA bilaterally, no wheezing, no rhonchi Abdominal: Soft, NT, ND, bowel sounds + Extremities: no edema, no cyanosis Neurology: Alert, awake, following commands.   The results of significant diagnostics from this hospitalization (including imaging, microbiology, ancillary and laboratory) are listed below for reference.     Microbiology: Recent Results (from the past 240 hour(s))  MRSA PCR Screening     Status: None   Collection Time: 08/04/16  7:00 AM  Result Value Ref Range Status   MRSA by PCR NEGATIVE NEGATIVE Final    Comment:        The GeneXpert MRSA Assay (FDA approved for NASAL specimens only), is one component of a comprehensive MRSA colonization surveillance program. It is not intended to diagnose MRSA infection nor to guide or monitor treatment for MRSA infections.      Labs: BNP (last 3 results) No results for  input(s): BNP in the last 8760 hours. Basic Metabolic Panel:  Recent Labs Lab 08/04/16 0748 08/04/16 0844 08/05/16 0418 08/06/16 0421 08/07/16 0432  NA  --  142 141 141 139  K  --  4.5 4.4 4.7 4.7  CL  --  114* 112* 110 112*  CO2  --  19* 20* 21* 18*  GLUCOSE  --  147* 88 122* 143*  BUN  --  44* 48* 50* 45*  CREATININE  --  2.19* 2.37* 2.22* 1.93*  CALCIUM  --  9.1 8.9 9.0 8.7*  MG 1.9  --   --   --   --    Liver Function Tests:  Recent Labs Lab 08/05/16 0418  AST 19  ALT 16*  ALKPHOS 86  BILITOT 0.8  PROT 6.1*  ALBUMIN 3.5   No results for input(s): LIPASE, AMYLASE in the last 168 hours. No results for input(s): AMMONIA in the last 168 hours. CBC:  Recent Labs Lab 08/04/16 1032 08/05/16 0418 08/06/16 0421 08/07/16 0432  WBC 6.5 5.7 4.7 4.9  HGB 10.7* 10.7* 11.1* 10.3*  HCT 32.6* 32.6* 33.4* 31.0*  MCV 84.2 85.1 84.8 84.5  PLT 148* 132* 138* 146*   Cardiac Enzymes:  Recent Labs Lab 08/04/16 0748 08/04/16 1314 08/04/16 1843  TROPONINI 0.59* 0.81* 0.75*   BNP: Invalid input(s): POCBNP CBG:  Recent Labs Lab 08/06/16 1134 08/06/16 1707 08/06/16 2125 08/07/16 0723 08/07/16 1114  GLUCAP 156* 111* 178* 124* 203*   D-Dimer No results for input(s): DDIMER in the last 72 hours. Hgb A1c No results for input(s): HGBA1C in the last 72 hours. Lipid Profile  Recent Labs  08/05/16 0418  CHOL 106  HDL 38*  LDLCALC 50  TRIG 88  CHOLHDL 2.8   Thyroid function studies No results for input(s): TSH, T4TOTAL, T3FREE, THYROIDAB in the last 72 hours.  Invalid input(s): FREET3 Anemia work up No results for input(s): VITAMINB12, FOLATE, FERRITIN, TIBC, IRON, RETICCTPCT in the last 72 hours. Urinalysis No results found for: COLORURINE, APPEARANCEUR, [place], [place], [place], [place], [place], [place], PROTEINUR, UROBILINOGEN, NITRITE, LEUKOCYTESUR Sepsis Labs Invalid input(s): PROCALCITONIN,  WBC,  LACTICIDVEN Microbiology Recent Results (from  the past 240 hour(s))  MRSA PCR Screening     Status: None   Collection Time: 08/04/16  7:00 AM  Result Value Ref Range Status   MRSA by PCR NEGATIVE NEGATIVE Final    Comment:        The GeneXpert MRSA Assay (FDA approved for NASAL specimens only), is one component of a comprehensive MRSA colonization surveillance program. It is not intended to diagnose MRSA infection nor to guide or monitor treatment for MRSA infections.      Time coordinating discharge: 33 minutes  SIGNED:   Rosita Fire, MD  [facility] 08/07/2016, 11:28 AM  If 7PM-7AM, please contact night-coverage [email or web address] Password [email or web address]

## 2016-08-07 NOTE — Progress Notes (Signed)
ANTICOAGULATION CONSULT NOTE - Follow Up Consult  Pharmacy Consult for Heparin and Coumadin Indication: atrial fibrillation  No Known Allergies  Patient Measurements: Height: 5\' 11"  (180.3 cm) Weight: 190 lb 1.6 oz (86.2 kg) IBW/kg (Calculated) : 75.3 Heparin Dosing Weight: 85.9 kg  Vital Signs: Temp: 97.8 F (36.6 C) (02/20 0439) BP: 134/81 (02/20 0439) Pulse Rate: 65 (02/20 0439)  Labs:  Recent Labs  08/04/16 0748 08/04/16 0844  08/04/16 1314 08/04/16 1843  08/05/16 0418 08/06/16 0421 08/07/16 0432  HGB  --   --   < >  --   --   --  10.7* 11.1* 10.3*  HCT  --   --   < >  --   --   --  32.6* 33.4* 31.0*  PLT  --   --   < >  --   --   --  132* 138* 146*  LABPROT  --   --   --   --   --   --   --  13.8 14.4  INR  --   --   --   --   --   --   --  1.06 1.11  HEPARINUNFRC  --   --   --   --   --   < > 0.56 0.30 0.17*  CREATININE  --  2.19*  --   --   --   --  2.37* 2.22*  --   TROPONINI 0.59*  --   --  0.81* 0.75*  --   --   --   --   < > = values in this interval not displayed.  Estimated Creatinine Clearance: 28.7 mL/min (by C-G formula based on SCr of 2.22 mg/dL (H)).  Assessment: 33 YOM admitted with dyspnea and found to be in Afib with RVR and NSTEMI. He has hx of CAD s/p DES to LAD. Not on anticoagulants prior to admission. Heparin resumed post cath 2/19 and Coumadin started. Heparin level subtherapeutic (0.17) on gtt at 900 units/hr. No issues with line or bleeding reported per RN. CBC stable.  Goal of Therapy:  INR 2-3 Heparin level 0.3-0.7 units/ml Monitor platelets by anticoagulation protocol: Yes   Plan:  Increase heparin to 1050 units/hr. Will f/u 8 hr heparin level  Sherlon Handing, PharmD, BCPS Clinical pharmacist, pager [phone] 08/07/2016,5:15 AM

## 2016-08-07 NOTE — Plan of Care (Signed)
Problem: Education: Goal: Understanding of CV disease, CV risk reduction, and recovery process will improve Outcome: Progressing RN educated patient on right upper extremity restrictions post heart catheterization, dressing over site to remain in place for twenty four hours and to keep right wrist elevated on a pillow for the next twenty four hours.  Patient verbalized understanding of information.

## 2016-08-07 NOTE — Progress Notes (Signed)
   Patient in and out of afib with RVR. Will give IV amio bolus now 150mg  and then start 400mg  BID po amiodarone tomorrow AM. Watch over night and probable discharge home tomorrow on lovenox.  Angelena Form PA-C  [facility]

## 2016-08-07 NOTE — Progress Notes (Signed)
    Subjective:  Feeling well today. No chest pain or dyspnea.   Objective:  Vital Signs in the last 24 hours: Temp:  [97.8 F (36.6 C)] 97.8 F (36.6 C) (02/20 0741) Pulse Rate:  [64-71] 64 (02/20 0741) Resp:  [12-19] 12 (02/20 0439) BP: (128-134)/(72-89) 132/89 (02/20 0741) SpO2:  [96 %-99 %] 99 % (02/20 0741) Weight:  [190 lb 1.6 oz (86.2 kg)] 190 lb 1.6 oz (86.2 kg) (02/20 0439)  Intake/Output from previous day: 02/19 0701 - 02/20 0700 In: 1241.6 [P.O.:900; I.V.:341.6] Out: 1850 [Urine:1850]  Physical Exam: Pt is alert and oriented, NAD HEENT: normal Neck: JVP - normal Lungs: CTA bilaterally CV: RRR without murmur or gallop Abd: soft, NT, Positive BS, no hepatomegaly Ext: no C/C/E, distal pulses intact and equal. Right radial site clear. Skin: warm/dry no rash  Lab Results:  Recent Labs  08/06/16 0421 08/07/16 0432  WBC 4.7 4.9  HGB 11.1* 10.3*  PLT 138* 146*    Recent Labs  08/06/16 0421 08/07/16 0432  NA 141 139  K 4.7 4.7  CL 110 112*  CO2 21* 18*  GLUCOSE 122* 143*  BUN 50* 45*  CREATININE 2.22* 1.93*    Recent Labs  08/04/16 1314 08/04/16 1843  TROPONINI 0.81* 0.75*    Cardiac Studies: Cardiac cath and echo studies reviewed.   Tele: Sinus rhythm, PAC's  Assessment/Plan:  1. Paroxysmal atrial fibrillation with RVR: now in sinus rhythm. Pt on heparin and started on warfarin (not a good DOAC candidate with advanced kidney disease). OK to all INR to drift up - he will need close FU with his PCP in [facility] and should have INR drawn at end of this week. IF lovenox can be arranged that would be reasonable. Continue metoprolol succinate.   2. Acute systolic heart failure: no evidence of volume overload on his exam today. 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 Will plan to reassess LV function in 3 months. No ACE/ARB or aldactone with advanced renal disease. Continue Toprol XL.   3. CAD, native vessel,  with elevated troponin: stable coronary anatomy at cath. Patent LAD stent. Since he now requires oral anticoagulation, would switch him to clopidogrel as this would be associated with lower bleeding risk than ticagrelor. He will discontinue study medication (brilinta/aspirin) and his only 'blood thinning' medications should be clopidogrel 75 mg daily and warfarin as directed.   Follow-up: will arrange APP visit in 2 weeks and I would like to see him back in 2-3 months with an echo.   Sherren Mocha, M.D. 08/07/2016, 10:08 AM

## 2016-08-07 NOTE — Progress Notes (Signed)
Pt noted to go in and out of rapid Afib - HR 130s-140s. Pt asymptomatic. Katie, PA with cards on floor and made aware  And in to go see patient. Will continue to monitor closely.

## 2016-08-07 NOTE — Progress Notes (Addendum)
ANTICOAGULATION CONSULT NOTE - Follow Up Consult  Pharmacy Consult for Heparin and Coumadin Indication: atrial fibrillation  No Known Allergies  Patient Measurements: Height: 5\' 11"  (180.3 cm) Weight: 190 lb 1.6 oz (86.2 kg) IBW/kg (Calculated) : 75.3 Heparin Dosing Weight: 86 kg  Vital Signs: Temp: 97.8 F (36.6 C) (02/20 0741) Temp Source: Oral (02/20 0741) BP: 132/89 (02/20 0741) Pulse Rate: 64 (02/20 0741)  Labs:  Recent Labs  08/04/16 1314 08/04/16 1843  08/05/16 0418 08/06/16 0421 08/07/16 0432  HGB  --   --   < > 10.7* 11.1* 10.3*  HCT  --   --   --  32.6* 33.4* 31.0*  PLT  --   --   --  132* 138* 146*  LABPROT  --   --   --   --  13.8 14.4  INR  --   --   --   --  1.06 1.11  HEPARINUNFRC  --   --   < > 0.56 0.30 0.17*  CREATININE  --   --   --  2.37* 2.22* 1.93*  TROPONINI 0.81* 0.75*  --   --   --   --   < > = values in this interval not displayed.  Estimated Creatinine Clearance: 33.1 mL/min (by C-G formula based on SCr of 1.93 mg/dL (H)).  Assessment: 68 YOM admitted with dyspnea and found to be in Afib with RVR and NSTEMI. He has hx of CAD s/p DES to LAD. Not on anticoagulants prior to admission.    S/p cardiac cath 08/06/16.  Heparin resumed at 900 units/hr last night but level was only 0.17 ~5am today and rate increased to 1050 units/hr.  Intended to change to Lovenox 80 mg sq q24hrs for bridge at discharge today, but went back into afib and discharge cancelled.  To continue IV heparin today per Cardiology.  Drip had not been stopped yet to convert to Lovenox. To begin IV amiodarone.   INR 1.11 today after Coumadin 7.5 mg x 1 on 08/06/16.       Was on TWILGIHT study meds at home, but had not been ordered as inpatient. Now changed to Plavix. Aspirin discontinued.  Goal of Therapy:  INR 2-3 Heparin level 0.3-0.7 units/ml Monitor platelets by anticoagulation protocol: Yes   Plan:   Lovenox cancelled; did not receive a dose today.  Continue heparin drip  at 1050 units/hr.  Heparin level this afternoon as previously planned.  Coumadin 7.5mg  again today. Will need to watch for increased sensitivity with addition of Amiodarone.  Daily heparin level, PT/INR and CBC while in the hospital.  Now anticipating discharge and conversion to Lovenox on 08/08/16.  Arty Baumgartner, [place] Pager: [phone] 08/07/2016,11:53 AM  Addendum:   Heparin level is afternoon is low therapeutic (0.34) on heparin drip at 1050 units/hr.   No changes this evening.   Next labs in am.  Consuello Masse, RPh  4:18 PM 08/07/2016

## 2016-08-08 LAB — CBC
HEMATOCRIT: 34.5 % — AB (ref 39.0–52.0)
HEMOGLOBIN: 11.5 g/dL — AB (ref 13.0–17.0)
MCH: 28.2 pg (ref 26.0–34.0)
MCHC: 33.3 g/dL (ref 30.0–36.0)
MCV: 84.6 fL (ref 78.0–100.0)
Platelets: 139 10*3/uL — ABNORMAL LOW (ref 150–400)
RBC: 4.08 MIL/uL — AB (ref 4.22–5.81)
RDW: 14.1 % (ref 11.5–15.5)
WBC: 5.7 10*3/uL (ref 4.0–10.5)

## 2016-08-08 LAB — GLUCOSE, CAPILLARY
GLUCOSE-CAPILLARY: 141 mg/dL — AB (ref 65–99)
GLUCOSE-CAPILLARY: 236 mg/dL — AB (ref 65–99)
Glucose-Capillary: 121 mg/dL — ABNORMAL HIGH (ref 65–99)
Glucose-Capillary: 163 mg/dL — ABNORMAL HIGH (ref 65–99)
Glucose-Capillary: 253 mg/dL — ABNORMAL HIGH (ref 65–99)

## 2016-08-08 LAB — HEPARIN LEVEL (UNFRACTIONATED): Heparin Unfractionated: 0.5 IU/mL (ref 0.30–0.70)

## 2016-08-08 LAB — PROTIME-INR
INR: 1.19
PROTHROMBIN TIME: 15.2 s (ref 11.4–15.2)

## 2016-08-08 MED ORDER — AMIODARONE LOAD VIA INFUSION
150.0000 mg | Freq: Once | INTRAVENOUS | Status: AC
  Administered 2016-08-08: 150 mg via INTRAVENOUS
  Filled 2016-08-08: qty 83.34

## 2016-08-08 MED ORDER — AMIODARONE HCL IN DEXTROSE 360-4.14 MG/200ML-% IV SOLN
60.0000 mg/h | INTRAVENOUS | Status: AC
  Administered 2016-08-08 (×2): 60 mg/h via INTRAVENOUS
  Filled 2016-08-08: qty 200

## 2016-08-08 MED ORDER — AMIODARONE HCL IN DEXTROSE 360-4.14 MG/200ML-% IV SOLN
30.0000 mg/h | INTRAVENOUS | Status: DC
  Administered 2016-08-09: 30 mg/h via INTRAVENOUS
  Filled 2016-08-08 (×2): qty 200

## 2016-08-08 MED ORDER — WARFARIN SODIUM 10 MG PO TABS
10.0000 mg | ORAL_TABLET | Freq: Once | ORAL | Status: AC
  Administered 2016-08-08: 10 mg via ORAL
  Filled 2016-08-08: qty 1

## 2016-08-08 NOTE — Progress Notes (Signed)
Patient had episodes of atrial fibrillation throughout night shift.  At times heart rate up to 150, did not sustain.  RN primarily noted heart rate one teens to one thirties during atrial fibrillation.  During one episode of atrial fibrillation RN in to check on patient and patient asymptomatic laying in bed.  Will continue to monitor.

## 2016-08-08 NOTE — Care Management Important Message (Signed)
Important Message  Patient Details  Name: KIAEEM GOOLD MRN: [id number] Date of Birth: 1935-03-26   Medicare Important Message Given:  Yes    Mckynna Vanloan Abena 08/08/2016, 10:09 AM

## 2016-08-08 NOTE — Progress Notes (Signed)
    Subjective:  The patient is feeling okay. He denies any palpitations, chest pain, or shortness of breath . He has not been out of bed much.   Objective:   Vital Signs in the last 24 hours: Temp:  [97.7 F (36.5 C)-97.8 F (36.6 C)] 97.8 F (36.6 C) (02/21 0500) Pulse Rate:  [62-68] 65 (02/21 0758) Resp:  [18] 18 (02/21 0500) BP: (115-141)/(65-77) 141/77 (02/21 0758) SpO2:  [98 %] 98 % (02/21 0500) Weight:  [190 lb (86.2 kg)] 190 lb (86.2 kg) (02/21 0500)  Intake/Output from previous day: 02/20 0701 - 02/21 0700 In: 998.5 [P.O.:720; I.V.:278.5] Out: 1450 [Urine:1450]  Physical Exam: Pt is alert and oriented, NAD HEENT: normal Neck: JVP - normal, carotids 2+= without bruits Lungs: CTA bilaterally CV: irregularly irregular without murmur or gallop Abd: soft, NT, Positive BS, no hepatomegaly Ext: no C/C/E, distal pulses intact and equal Skin: warm/dry no rash   Lab Results:  Recent Labs  08/07/16 0432 08/08/16 0532  WBC 4.9 5.7  HGB 10.3* 11.5*  PLT 146* 139*    Recent Labs  08/06/16 0421 08/07/16 0432  NA 141 139  K 4.7 4.7  CL 110 112*  CO2 21* 18*  GLUCOSE 122* 143*  BUN 50* 45*  CREATININE 2.22* 1.93*   No results for input(s): TROPONINI in the last 72 hours.  Invalid input(s): CK, MB  Tele: NSR - AF with RVR, frequent PVC's  Assessment/Plan:  1. Paroxysmal atrial fibrillation with RVR: The patient continues to go in and out of sinus rhythm. With his newly diagnosed severe cardiomyopathy, I think we should resume IV amiodarone to try to settle his rhythm down. He is going in and out of atrial fibrillation frequently with rapid ventricular rates. We'll give him another 24 hours of IV amiodarone and then hopefully convert him back to an oral loading regimen tomorrow. Continue heparin and warfarin per pharmacy. Continue metoprolol succinate.  2. Acute systolic heart failure: Remains euvolemic on exam. Continue metoprolol succinate.  3. Coronary  artery disease, native vessel, with elevated troponin: Stable coronary anatomy by recent heart catheterization.  Will follow-up tomorrow morning after another 24 hours of IV amiodarone. Hopefully his rhythm will settle down and he can be discharged on an extended oral amiodarone load tomorrow.    Sherren Mocha, M.D. 08/08/2016, 1:17 PM

## 2016-08-08 NOTE — Progress Notes (Signed)
ANTICOAGULATION CONSULT NOTE - Follow Up Consult  Pharmacy Consult for Heparin and Coumadin Indication: atrial fibrillation  No Known Allergies  Patient Measurements: Height: 5\' 11"  (180.3 cm) Weight: 190 lb (86.2 kg) IBW/kg (Calculated) : 75.3 Heparin Dosing Weight: 86 kg  Vital Signs: Temp: 97.8 F (36.6 C) (02/21 0500) Temp Source: Oral (02/21 0500) BP: 141/77 (02/21 0758) Pulse Rate: 65 (02/21 0758)  Labs:  Recent Labs  08/06/16 0421 08/07/16 0432 08/07/16 1503 08/08/16 0532  HGB 11.1* 10.3*  --  11.5*  HCT 33.4* 31.0*  --  34.5*  PLT 138* 146*  --  139*  LABPROT 13.8 14.4  --  15.2  INR 1.06 1.11  --  1.19  HEPARINUNFRC 0.30 0.17* 0.34 0.50  CREATININE 2.22* 1.93*  --   --     Estimated Creatinine Clearance: 33.1 mL/min (by C-G formula based on SCr of 1.93 mg/dL (H)).  Assessment: 54 YOM admitted with dyspnea and found to be in Afib with RVR and NSTEMI. He has hx of CAD s/p DES to LAD. Not on anticoagulants prior to admission.    S/p cardiac cath 08/06/16.  Heparin resumed 2/19 pm.  Intended to change to Lovenox 80 mg sq q24hrs for bridge at discharge on 2/20, but went back into afib and discharge cancelled. Continues on IV heparin rather than switch to Lovenox, which had not yet been started.  Heparin level remains therapeutic (0.50) on 1050 units/hr.   INR only 1.19 today after Coumadin 7.5 mg x 2 days and Amio added 2/20.       Was on TWILGIHT study meds at home, but had not been ordered as inpatient. Now changed to Plavix. Aspirin discontinued.   Goal of Therapy:  INR 2-3 Heparin level 0.3-0.7 units/ml Monitor platelets by anticoagulation protocol: Yes   Plan:   Continue heparin drip at 1050 units/hr..  Coumadin 10 mg x 1 today. Will need to watch for increased sensitivity with addition of Amiodarone.  Daily heparin level, PT/INR and CBC while in the hospital.  Will follow up for timing of discharge and conversion to Lovenox 80 mg sq q24hrs.  If  discharged today, would give Rx for Coumadin 5 mg tablets, with instructions to take 7.5 mg on 2/22, then INR check on 2/23 as previusly planned.  Arty Baumgartner, [place] Pager: [phone] 08/08/2016,12:24 PM

## 2016-08-08 NOTE — Progress Notes (Signed)
PROGRESS NOTE        PATIENT DETAILS Name: Keith Church Age: 82 y.o. Sex: male Date of Birth: June 24, 1934 Admit Date: 08/04/2016 Admitting Physician Elwin Mocha, MD [id number] [facility] [place]  Brief Narrative: Patient is a 82 y.o. male with known history of CAD, hypertension, chronic kidney disease stage III who presented with A. fib and RVR. Underwent LHC which showed stable anatomy, and patent LAD stent. He continues to have episodes of A. fib with RVR requiring IV amiodarone. See below for further details  Subjective: No chest pain or shortness of breath. Lying comfortably in bed this morning.  Assessment/Plan: Atrial fibrillation with RVR: Back in sinus rhythm but continues to have episodes of A. fib with RVR. Cardiology recommending to continue with IV amiodarone, overlapping heparin and Coumadin. Home with amiodarone when okay with cardiology.  Non-STEMI: No chest pain, LHC on 2/19 showed stable coronary anatomy with patent LAD stent. Continue Plavix, statin and beta blocker.  Chronic systolic heart failure: Clinically compensated-continue metoprolol.  Chronic kidney disease stage III: Creatinine close to usual baseline. Follow periodically.  Hypertension: Controlled, continue Imdur and metoprolol.  Type II DM: CBGs stable with SSI, resume oral hypoglycemic agents on discharge.  DVT Prophylaxis: Full dose anticoagulation with Heparin/Coumadin  Code Status: Full code   Family Communication: Spouse at bedside  Disposition Plan: Remain inpatient-home when cleared by Cards  Antimicrobial agents: Anti-infectives    None      Procedures:  Left Heart Cath and Coronary Angiography  Conclusion     Prox LAD lesion, 0 %stenosed at prior stent site  Mid LAD with diffuse 50% in stent disease  Mid RCA-1 lesion, 30 %stenosed.  Mid RCA-2 lesion, 40 %stenosed.  Mid LAD lesion, 50 %stenosed.  Lat 2nd Mrg lesion, 90  %stenosed.  LV end diastolic pressure is normal.   1. CAD with patent stent at the origin of the LAD. The stent in the mid LAD has moderate diffuse in stent disease. The LCx gives rise to a single large OM branch that trifurcates. There is 90% stenosis of both smaller side branches. 2. Normal LV EDP  Compared to prior cardiac cath in July 2017 there is no significant change   CONSULTS:  cardiology  Time spent: 25- minutes-Greater than 50% of this time was spent in counseling, explanation of diagnosis, planning of further management, and coordination of care.  MEDICATIONS: Scheduled Meds: . amiodarone  150 mg Intravenous Once  . atorvastatin  40 mg Oral Daily  . clopidogrel  75 mg Oral Daily  . insulin aspart  0-5 Units Subcutaneous QHS  . insulin aspart  0-9 Units Subcutaneous TID WC  . isosorbide mononitrate  30 mg Oral Daily  . metoprolol succinate  50 mg Oral Daily  . patient's guide to using coumadin book   Does not apply Once  . sodium chloride flush  3 mL Intravenous Q12H  . sodium chloride flush  3 mL Intravenous Q12H  . warfarin  10 mg Oral ONCE-1800  . Warfarin - Pharmacist Dosing Inpatient   Does not apply q1800   Continuous Infusions: . amiodarone     Followed by  . amiodarone    . heparin 1,050 Units/hr (08/07/16 2208)   PRN Meds:.sodium chloride, acetaminophen, fluticasone, ondansetron (ZOFRAN) IV, sodium chloride flush, sodium chloride flush   PHYSICAL EXAM: Vital signs:  Vitals:   08/07/16 1222 08/07/16 2015 08/08/16 0500 08/08/16 0758  BP: (!) 122/52 115/65 133/68 (!) 141/77  Pulse:  68 62 65  Resp:  18 18   Temp:  97.7 F (36.5 C) 97.8 F (36.6 C)   TempSrc:  Oral Oral   SpO2: 100% 98% 98%   Weight:   86.2 kg (190 lb)   Height:       Filed Weights   08/06/16 0515 08/07/16 0439 08/08/16 0500  Weight: 85.9 kg (189 lb 4.8 oz) 86.2 kg (190 lb 1.6 oz) 86.2 kg (190 lb)   Body mass index is 26.5 kg/m.   General appearance :Awake, alert, not in  any distress. Speech Clear.  Eyes:, pupils equally reactive to light and accomodation,no scleral icterus. HEENT: Atraumatic and Normocephalic Neck: supple, no JVD. No cervical lymphadenopathy.  Resp:Good air entry bilaterally, no added sounds  CVS: S1 S2 regular GI: Bowel sounds present, Non tender and not distended with no gaurding, rigidity or rebound.No organomegaly Extremities: B/L Lower Ext shows no edema, both legs are warm to touch Neurology:  speech clear,Non focal, sensation is grossly intact. Psychiatric: Normal judgment and insight. Alert and oriented x 3. Musculoskeletal:No digital cyanosis Skin:No Rash, warm and dry Wounds:N/A  I have personally reviewed following labs and imaging studies  LABORATORY DATA: CBC:  Recent Labs Lab 08/04/16 1032 08/05/16 0418 08/06/16 0421 08/07/16 0432 08/08/16 0532  WBC 6.5 5.7 4.7 4.9 5.7  HGB 10.7* 10.7* 11.1* 10.3* 11.5*  HCT 32.6* 32.6* 33.4* 31.0* 34.5*  MCV 84.2 85.1 84.8 84.5 84.6  PLT 148* 132* 138* 146* 139*    Basic Metabolic Panel:  Recent Labs Lab 08/04/16 0748 08/04/16 0844 08/05/16 0418 08/06/16 0421 08/07/16 0432  NA  --  142 141 141 139  K  --  4.5 4.4 4.7 4.7  CL  --  114* 112* 110 112*  CO2  --  19* 20* 21* 18*  GLUCOSE  --  147* 88 122* 143*  BUN  --  44* 48* 50* 45*  CREATININE  --  2.19* 2.37* 2.22* 1.93*  CALCIUM  --  9.1 8.9 9.0 8.7*  MG 1.9  --   --   --   --     GFR: Estimated Creatinine Clearance: 33.1 mL/min (by C-G formula based on SCr of 1.93 mg/dL (H)).  Liver Function Tests:  Recent Labs Lab 08/05/16 0418  AST 19  ALT 16*  ALKPHOS 86  BILITOT 0.8  PROT 6.1*  ALBUMIN 3.5   No results for input(s): LIPASE, AMYLASE in the last 168 hours. No results for input(s): AMMONIA in the last 168 hours.  Coagulation Profile:  Recent Labs Lab 08/06/16 0421 08/07/16 0432 08/08/16 0532  INR 1.06 1.11 1.19    Cardiac Enzymes:  Recent Labs Lab 08/04/16 0748 08/04/16 1314  08/04/16 1843  TROPONINI 0.59* 0.81* 0.75*    BNP (last 3 results) No results for input(s): PROBNP in the last 8760 hours.  HbA1C: No results for input(s): HGBA1C in the last 72 hours.  CBG:  Recent Labs Lab 08/07/16 1114 08/07/16 1614 08/07/16 2107 08/08/16 0734 08/08/16 1111  GLUCAP 203* 121* 210* 141* 236*    Lipid Profile: No results for input(s): CHOL, HDL, LDLCALC, TRIG, CHOLHDL, LDLDIRECT in the last 72 hours.  Thyroid Function Tests: No results for input(s): TSH, T4TOTAL, FREET4, T3FREE, THYROIDAB in the last 72 hours.  Anemia Panel: No results for input(s): VITAMINB12, FOLATE, FERRITIN, TIBC, IRON, RETICCTPCT in the last 72 hours.  Urine analysis: No results found for: COLORURINE, APPEARANCEUR, LABSPEC, PHURINE, GLUCOSEU, HGBUR, BILIRUBINUR, KETONESUR, PROTEINUR, UROBILINOGEN, NITRITE, LEUKOCYTESUR  Sepsis Labs: Lactic Acid, Venous No results found for: LATICACIDVEN  MICROBIOLOGY: Recent Results (from the past 240 hour(s))  MRSA PCR Screening     Status: None   Collection Time: 08/04/16  7:00 AM  Result Value Ref Range Status   MRSA by PCR NEGATIVE NEGATIVE Final    Comment:        The GeneXpert MRSA Assay (FDA approved for NASAL specimens only), is one component of a comprehensive MRSA colonization surveillance program. It is not intended to diagnose MRSA infection nor to guide or monitor treatment for MRSA infections.     RADIOLOGY STUDIES/RESULTS: No results found.   LOS: 4 days   Oren Binet, MD  [facility] Pager:[phone]  If 7PM-7AM, please contact night-coverage [email or web address] Password [email or web address] 08/08/2016, 1:41 PM

## 2016-08-09 LAB — CBC
HCT: 34 % — ABNORMAL LOW (ref 39.0–52.0)
Hemoglobin: 11.2 g/dL — ABNORMAL LOW (ref 13.0–17.0)
MCH: 27.6 pg (ref 26.0–34.0)
MCHC: 32.9 g/dL (ref 30.0–36.0)
MCV: 83.7 fL (ref 78.0–100.0)
Platelets: 135 10*3/uL — ABNORMAL LOW (ref 150–400)
RBC: 4.06 MIL/uL — ABNORMAL LOW (ref 4.22–5.81)
RDW: 13.8 % (ref 11.5–15.5)
WBC: 6.4 10*3/uL (ref 4.0–10.5)

## 2016-08-09 LAB — GLUCOSE, CAPILLARY
Glucose-Capillary: 173 mg/dL — ABNORMAL HIGH (ref 65–99)
Glucose-Capillary: 188 mg/dL — ABNORMAL HIGH (ref 65–99)
Glucose-Capillary: 271 mg/dL — ABNORMAL HIGH (ref 65–99)

## 2016-08-09 LAB — PROTIME-INR
INR: 1.63
Prothrombin Time: 19.5 seconds — ABNORMAL HIGH (ref 11.4–15.2)

## 2016-08-09 LAB — HEPARIN LEVEL (UNFRACTIONATED): HEPARIN UNFRACTIONATED: 0.51 [IU]/mL (ref 0.30–0.70)

## 2016-08-09 MED ORDER — AMIODARONE HCL 200 MG PO TABS
400.0000 mg | ORAL_TABLET | Freq: Once | ORAL | Status: AC
  Administered 2016-08-09: 400 mg via ORAL
  Filled 2016-08-09: qty 2

## 2016-08-09 MED ORDER — AMIODARONE HCL 200 MG PO TABS: 400.0000 mg | ORAL_TABLET | Freq: Two times a day (BID) | ORAL | 0 refills | Status: DC

## 2016-08-09 MED ORDER — CLOPIDOGREL BISULFATE 75 MG PO TABS: 75.0000 mg | ORAL_TABLET | Freq: Every day | ORAL | 0 refills | Status: DC

## 2016-08-09 MED ORDER — ENOXAPARIN SODIUM 80 MG/0.8ML ~~LOC~~ SOLN
80.0000 mg | Freq: Once | SUBCUTANEOUS | Status: AC
Start: 2016-08-09 — End: 2016-08-09
  Administered 2016-08-09: 80 mg via SUBCUTANEOUS
  Filled 2016-08-09: qty 0.8

## 2016-08-09 MED ORDER — WARFARIN SODIUM 5 MG PO TABS
5.0000 mg | ORAL_TABLET | Freq: Once | ORAL | Status: AC
  Administered 2016-08-09: 5 mg via ORAL
  Filled 2016-08-09: qty 1

## 2016-08-09 NOTE — Progress Notes (Signed)
ANTICOAGULATION CONSULT NOTE - Follow Up Consult  Pharmacy Consult for Heparin and Coumadin Indication: atrial fibrillation  No Known Allergies  Patient Measurements: Height: 5\' 11"  (180.3 cm) Weight: 188 lb 11.2 oz (85.6 kg) IBW/kg (Calculated) : 75.3 Heparin Dosing Weight: 86 kg  Vital Signs: Temp: 97.8 F (36.6 C) (02/22 0543) Temp Source: Oral (02/22 0543) BP: 153/79 (02/22 0820) Pulse Rate: 111 (02/22 0820)  Labs:  Recent Labs  08/07/16 0432 08/07/16 1503 08/08/16 0532 08/09/16 0237  HGB 10.3*  --  11.5* 11.2*  HCT 31.0*  --  34.5* 34.0*  PLT 146*  --  139* 135*  LABPROT 14.4  --  15.2 19.5*  INR 1.11  --  1.19 1.63  HEPARINUNFRC 0.17* 0.34 0.50 0.51  CREATININE 1.93*  --   --   --     Estimated Creatinine Clearance: 33.1 mL/min (by C-G formula based on SCr of 1.93 mg/dL (H)).  Assessment: 48 YOM with new onset Afib, and started on heparin/coumaidn. Heparin level 0.51 this morning and INR 1.63, trending up. Heparin is d/c'd this morning, and pt is stable for discharge. Noted, pt will continue on amiodarone after discharge.  Goal of Therapy:  INR 2-3 Heparin level 0.3-0.7 units/ml Monitor platelets by anticoagulation protocol: Yes   Plan:  Coumadin 5 mg po x 1 today If goes home, recommend 5 mg daily and outpatient INR f/u Friday or Monday  Maryanna Shape, PharmD, BCPS  Clinical Pharmacist  Pager: [phone]   08/09/2016,1:11 PM

## 2016-08-09 NOTE — Plan of Care (Signed)
Problem: Cardiac: Goal: Ability to achieve and maintain adequate cardiopulmonary perfusion will improve Outcome: Progressing IV amiodarone and IV heparin infusing per MD orders, medication education provided per RN.  Patient stated understanding.

## 2016-08-09 NOTE — Progress Notes (Signed)
Patient Name: Keith Church Date of Encounter: 08/09/2016  Primary Cardiologist: Dr. Tyrell Antonio Problem List     Principal Problem:   Atrial fibrillation with rapid ventricular response [facility]) Active Problems:   Diabetes ([place])   HYPERTENSION, BENIGN   CAD, NATIVE VESSEL   Elevated troponin   Chronic kidney disease (CKD), stage III (moderate)   Ischemic cardiomyopathy   Abnormal EKG   NSTEMI (non-ST elevated myocardial infarction) (HCC)     Subjective   Doing well today. No CP or dyspnea. No palpitations. Wife at bedside.   Inpatient Medications    Scheduled Meds: . atorvastatin  40 mg Oral Daily  . clopidogrel  75 mg Oral Daily  . insulin aspart  0-5 Units Subcutaneous QHS  . insulin aspart  0-9 Units Subcutaneous TID WC  . isosorbide mononitrate  30 mg Oral Daily  . metoprolol succinate  50 mg Oral Daily  . patient's guide to using coumadin book   Does not apply Once  . sodium chloride flush  3 mL Intravenous Q12H  . sodium chloride flush  3 mL Intravenous Q12H  . Warfarin - Pharmacist Dosing Inpatient   Does not apply q1800   Continuous Infusions: . amiodarone 30 mg/hr (08/09/16 0156)  . heparin 1,050 Units/hr (08/09/16 0800)   PRN Meds: sodium chloride, acetaminophen, fluticasone, ondansetron (ZOFRAN) IV, sodium chloride flush, sodium chloride flush   Vital Signs    Vitals:   08/08/16 1442 08/08/16 2004 08/09/16 0543 08/09/16 0820  BP: 119/69 118/80 (!) 146/80 (!) 153/79  Pulse: 64 87 (!) 48 (!) 111  Resp: 18 20 16 17   Temp: 98.6 F (37 C) 97.9 F (36.6 C) 97.8 F (36.6 C)   TempSrc: Oral Axillary Oral   SpO2: 99% 100% 98% 97%  Weight:   188 lb 11.2 oz (85.6 kg)   Height:        Intake/Output Summary (Last 24 hours) at 08/09/16 0857 Last data filed at 08/09/16 0800  Gross per 24 hour  Intake          2534.07 ml  Output             1600 ml  Net           934.07 ml   Filed Weights   08/07/16 0439 08/08/16 0500 08/09/16 0543  Weight: 190  lb 1.6 oz (86.2 kg) 190 lb (86.2 kg) 188 lb 11.2 oz (85.6 kg)    Physical Exam    GEN: Well nourished, well developed, in no acute distress.  HEENT: Grossly normal.  Neck: Supple, no JVD Cardiac: irregularly irregular, no murmurs, rubs, or gallops. No clubbing, cyanosis, edema.  Respiratory:  Respirations regular and unlabored, clear to auscultation bilaterally. GI: Soft, nontender, nondistended, BS + x 4. MS: no deformity or atrophy. Skin: warm and dry, no rash. Neuro:  Strength and sensation are intact. Psych: AAOx3.  Normal affect.  Labs    CBC  Recent Labs  08/08/16 0532 08/09/16 0237  WBC 5.7 6.4  HGB 11.5* 11.2*  HCT 34.5* 34.0*  MCV 84.6 83.7  PLT 139* [id number]*   Basic Metabolic Panel  Recent Labs  08/07/16 0432  NA 139  K 4.7  CL 112*  CO2 18*  GLUCOSE 143*  BUN 45*  CREATININE 1.93*  CALCIUM 8.7*   Liver Function Tests No results for input(s): AST, ALT, ALKPHOS, BILITOT, PROT, ALBUMIN in the last 72 hours. No results for input(s): LIPASE, AMYLASE in the last 72 hours. Cardiac  Enzymes No results for input(s): CKTOTAL, CKMB, CKMBINDEX, TROPONINI in the last 72 hours. BNP Invalid input(s): POCBNP D-Dimer No results for input(s): DDIMER in the last 72 hours. Hemoglobin A1C No results for input(s): HGBA1C in the last 72 hours. Fasting Lipid Panel No results for input(s): CHOL, HDL, LDLCALC, TRIG, CHOLHDL, LDLDIRECT in the last 72 hours. Thyroid Function Tests No results for input(s): TSH, T4TOTAL, T3FREE, THYROIDAB in the last 72 hours.  Invalid input(s): FREET3  Telemetry    afib with CVR from 90s-105 - Personally Reviewed  Radiology    No results found.  Cardiac Studies   08/06/16 Procedures  Left Heart Cath and Coronary Angiography  Conclusion    Prox LAD lesion, 0 %stenosed at prior stent site  Mid LAD with diffuse 50% in stent disease  Mid RCA-1 lesion, 30 %stenosed.  Mid RCA-2 lesion, 40 %stenosed.  Mid LAD lesion, 50  %stenosed.  Lat 2nd Mrg lesion, 90 %stenosed.  LV end diastolic pressure is normal.   1. CAD with patent stent at the origin of the LAD. The stent in the mid LAD has moderate diffuse in stent disease. The LCx gives rise to a single large OM branch that trifurcates. There is 90% stenosis of both smaller side branches. 2. Normal LV EDP  Compared to prior cardiac cath in July 2017 there is no significant change  Recommend: medical therapy. Will resume anticoagulation with heparin for Afib with plans to transition to coumadin. Will need to stay on study drug for TWILIGHT trial.      2D ECHO: 08/04/2016 LV EF: 20% -   25% Study Conclusions - Left ventricle: The cavity size was normal. Wall thickness was   normal. Systolic function was severely reduced. The estimated   ejection fraction was in the range of 20% to 25%. Diffuse   hypokinesis. Features are consistent with a pseudonormal left   ventricular filling pattern, with concomitant abnormal relaxation   and increased filling pressure (grade 2 diastolic dysfunction).   E/medial e&' > 15 suggesting LV end diastolic pressure at least 20   mmHg. - Aortic valve: There was no stenosis. - Mitral valve: There was mild regurgitation. - Left atrium: The atrium was mildly to moderately dilated. - Right ventricle: The cavity size was normal. Systolic function   was mildly reduced. - Tricuspid valve: Peak RV-RA gradient (S): 30 mm Hg. - Pulmonary arteries: PA peak pressure: 38 mm Hg (S). - Systemic veins: IVC measured 2.0 cm with < 50% respirophasic   variation, suggesting RA pressure 8 mmHg. Impressions: - Normal LV size with EF 20-25%, diffuse hypokinesis. Moderate   diastolic dysfunction with evidence for elevated LV filling   pressure. Normal RV size with mildly decreased systolic function.   Mild mitral regurgitation. Mild pulmonary hypertension.   Patient Profile       Assessment & Plan    1. Paroxysmal atrial fibrillation  with RVR: see below  2. Acute systolic heart failure: Remains euvolemic on exam. Continue metoprolol succinate.  3. Coronary artery disease, native vessel, with elevated troponin: Stable coronary anatomy by recent heart catheterization.  Signed, Angelena Form, PA-C  08/09/2016, 8:57 AM   Patient seen, examined. Available data reviewed. Agree with findings, assessment, and plan as outlined by Angelena Form, PA-C. History and exam documented by me above. The patient now has AF with controlled ventricular rate. He is asymptomatic. I think he is stable for discharge.   Plan:  Amiodarone 400 mg BID - will taper to 200 mg  when he is seen back in FU in 2 weeks  Coumadin per pharmacy - should have INR checked by his PCP on Monday (lives in [place])  Continue other medications without change  Will arrange his cardiac FU in [place] in 2 weeks  D/W Dr Valerie Salts, M.D. 08/09/2016 9:49 AM

## 2016-08-09 NOTE — Discharge Summary (Signed)
PATIENT DETAILS Name: Keith Church Age: 82 y.o. Sex: male Date of Birth: 1934/09/14 MRN: [id number]. Admitting Physician: Elwin Mocha, MD [id number] Urgent [place] Date: 08/04/2016 Discharge date: 08/09/2016  Recommendations for Outpatient Follow-up:  1. Follow up with PCP in 1-2 weeks 2. Please obtain BMP/CBC in one week 3. Please ensure follow-up with cardiology for further tapering of amiodarone. 4. Recheck INR on 2/26-and adjusted dosing of Coumadin accordingly.  Admitted From:  Home  Disposition: [place]: No  Equipment/Devices: None  Discharge Condition: Stable  CODE STATUS: FULL CODE  Diet recommendation:  Heart Healthy / Carb Modified / Regular / Dysphagia 1/2/3 with full aspiration precautions  Brief Summary: See H&P, Labs, Consult and Test reports for all details in brief, Patient is a 82 y.o. male with known history of CAD, hypertension, chronic kidney disease stage III who presented with A. fib and RVR. Underwent LHC which showed stable anatomy, and patent LAD stent. He continues to have episodes of A. fib with RVR requiring IV amiodarone. See below for further details  Brief Hospital Course: Atrial fibrillation with RVR: Back in sinus rhythm but continued to have episodes of A. fib with RVR. Cardiology recommended initiation of  IV amiodarone, along with overlapping heparin and Coumadin. Although plans were to send patient home on overlapping Lovenox and Coumadin-spoke with Dr. Burt Knack today-he is okay with the patient going home on just Coumadin. He still have very brief episodes of A. fib and is mostly in sinus rhythm. Patient instructed to follow with his PCP this coming Monday for an INR check. He is also being discharged home on oral amiodarone 400 mg twice a day, he will follow with Dr. Burt Knack in 2 weeks to taper it further.   Non-STEMI: No chest pain, LHC on 2/19 showed stable coronary anatomy with patent LAD stent.  Patient was initially on aspirin and Brilinta-but since he has not been started on Coumadin-cardiology recommends. Start Plavix. He is to continue his usual dose of statin and beta blocker.   Chronic systolic heart failure: Clinically compensated-continue metoprolol.  Chronic kidney disease stage III: Creatinine close to usual baseline. Follow periodically.   Hypertension: Controlled, continue Imdur and metoprolol.  Type II DM: CBGs stable with SSI, resume oral hypoglycemic agents and Levemir on discharge. He seems very familiar with his diabetic regimen.  Procedures/Studies: Left Heart Cath and Coronary Angiography  Conclusion     Prox LAD lesion, 0 %stenosed at prior stent site  Mid LAD with diffuse 50% in stent disease  Mid RCA-1 lesion, 30 %stenosed.  Mid RCA-2 lesion, 40 %stenosed.  Mid LAD lesion, 50 %stenosed.  Lat 2nd Mrg lesion, 90 %stenosed.  LV end diastolic pressure is normal.  1. CAD with patent stent at the origin of the LAD. The stent in the mid LAD has moderate diffuse in stent disease. The LCx gives rise to a single large OM branch that trifurcates. There is 90% stenosis of both smaller side branches. 2. Normal LV EDP  Compared to prior cardiac cath in July 2017 there is no significant change    Discharge Diagnoses:  Principal Problem:   Atrial fibrillation with rapid ventricular response (HCC) Active Problems:   Diabetes (HCC)   HYPERTENSION, BENIGN   CAD, NATIVE VESSEL   Elevated troponin   Chronic kidney disease (CKD), stage III (moderate)   Ischemic cardiomyopathy   Abnormal EKG   NSTEMI (non-ST elevated myocardial infarction) [facility])   Discharge Instructions:  Activity:  As tolerated with Full fall precautions use walker/cane & assistance as needed   Discharge Instructions    Call MD for:  difficulty breathing, headache or visual disturbances    Complete by:  As directed    Call MD for:  extreme fatigue    Complete by:  As  directed    Call MD for:  hives    Complete by:  As directed    Call MD for:  persistant dizziness or light-headedness    Complete by:  As directed    Call MD for:  persistant nausea and vomiting    Complete by:  As directed    Call MD for:  severe uncontrolled pain    Complete by:  As directed    Call MD for:  temperature >100.4    Complete by:  As directed    Diet - low sodium heart healthy    Complete by:  As directed    Diet Carb Modified    Complete by:  As directed    Discharge instructions    Complete by:  As directed    Please check your INR with PCP and adjust the dose. Watch for any sign of bleeding. If you see any sign of bleeding or bruises please talk to your doctor.   Increase activity slowly    Complete by:  As directed      Allergies as of 08/09/2016   No Known Allergies     Medication List    STOP taking these medications   AMBULATORY NON FORMULARY MEDICATION     TAKE these medications   acetaminophen 325 MG tablet Commonly known as:  TYLENOL Take 325-650 mg by mouth daily as needed for mild pain.   amiodarone 200 MG tablet Commonly known as:  PACERONE Take 2 tablets (400 mg total) by mouth 2 (two) times daily.   atorvastatin 40 MG tablet Commonly known as:  LIPITOR Take 1 tablet (40 mg total) by mouth daily.   cetirizine 10 MG tablet Commonly known as:  ZYRTEC Take 10 mg by mouth daily as needed for allergies.   clopidogrel 75 MG tablet Commonly known as:  PLAVIX Take 1 tablet (75 mg total) by mouth daily.   co-enzyme Q-10 50 MG capsule Take 50 mg by mouth daily.   epoetin alfa 10000 UNIT/ML injection Commonly known as:  EPOGEN,PROCRIT Inject 10,000 Units into the skin every 6 (six) weeks. If needed.  Patient taking subcutaneous injection once monthly as directed by his Cancer Doctor. Doesn't recall dosage.   fluticasone 50 MCG/ACT nasal spray Commonly known as:  FLONASE Place 1-2 sprays into both nostrils daily as needed for  allergies.   glipiZIDE 10 MG tablet Commonly known as:  GLUCOTROL Take 10 mg by mouth 2 (two) times daily before a meal.   insulin detemir 100 UNIT/ML injection Commonly known as:  LEVEMIR Inject 0.22 mLs (22 Units total) into the skin 2 (two) times daily.   isosorbide mononitrate 30 MG 24 hr tablet Commonly known as:  IMDUR Take 1 tablet (30 mg total) by mouth daily.   linagliptin 5 MG Tabs tablet Commonly known as:  TRADJENTA Take 5 mg by mouth daily.   metoprolol succinate 50 MG 24 hr tablet Commonly known as:  TOPROL-XL Take 1 tablet (50 mg total) by mouth daily. Take with or immediately following a meal.   nitroGLYCERIN 0.4 MG SL tablet Commonly known as:  NITROSTAT Place 1 tablet (0.4 mg total) under the tongue every  5 (five) minutes as needed for chest pain.   simethicone 125 MG chewable tablet Commonly known as:  MYLICON Chew 0000000 mg by mouth every 6 (six) hours as needed for flatulence.   vitamin C 500 MG tablet Commonly known as:  ASCORBIC ACID Take 500 mg by mouth daily.   Vitamin D (Ergocalciferol) 50000 units Caps capsule Commonly known as:  DRISDOL Take 50,000 Units by mouth every Sunday.   warfarin 5 MG tablet Commonly known as:  COUMADIN Take 1 tablet (5 mg total) by mouth one time only at 6 PM. Please adjust the dose depending on INR result, follow up with your PCP.      Follow-up Information    [facility] Go on 08/13/2016.   Specialty:  Family Medicine Why:  @ 1 pm  for PT/INR Contact information: [address] [place]  [place] [address] [phone]        Scott Weaver, PA-C Follow up on 08/21/2016.   Specialties:  Cardiology, Physician Assistant Why:  @ 3:45pm Contact information: [address]  [place] [address] [phone]          No Known Allergies   Consultations:   cardiology  Other Procedures/Studies:  No results found.   TODAY-DAY OF DISCHARGE:  Subjective:   Deivi  Wike today has no headache,no chest abdominal pain,no new weakness tingling or numbness, feels much better wants to go home today.   Objective:   Blood pressure (!) 153/79, pulse (!) 111, temperature 97.8 F (36.6 C), temperature source Oral, resp. rate 17, height 5\' 11" (1.803 m), weight 85.6 kg (188 lb 11.2 oz), SpO2 97 %.  Intake/Output Summary (Last 24 hours) at 08/09/16 1109 Last data filed at 08/09/16 0900  Gross per 24 hour  Intake          2774.07 ml  Output             1600 ml  Net          11 74.07 ml   Filed Weights   08/07/16 0439 08/08/16 0500 08/09/16 0543  Weight: 86.2 kg (190 lb 1.6 oz) 86.2 kg (190 lb) 85.6 kg (188 lb 11.2 oz)    Exam: Awake Alert, Oriented *3, No new F.N deficits, Normal affect [place].AT,PERRAL Supple Neck,No JVD, No cervical lymphadenopathy appriciated.  Symmetrical Chest wall movement, Good air movement bilaterally, CTAB RRR,No Gallops,Rubs or new Murmurs, No Parasternal Heave +ve B.Sounds, Abd Soft, Non tender, No organomegaly appriciated, No rebound -guarding or rigidity. No Cyanosis, Clubbing or edema, No new Rash or bruise   PERTINENT RADIOLOGIC STUDIES: No results found.   PERTINENT LAB RESULTS: CBC:  Recent Labs  08/08/16 0532 08/09/16 0237  WBC 5.7 6.4  HGB 11.5* 11.2*  HCT 34.5* 34.0*  PLT 139* 135*   CMET CMP     Component Value Date/Time   NA 139 08/07/2016 0432   K 4.7 08/07/2016 0432   CL 112 (H) 08/07/2016 0432   CO2 18 (L) 08/07/2016 0432   GLUCOSE 143 (H) 08/07/2016 0432   BUN 45 (H) 08/07/2016 0432   CREATININE 1.93 (H) 08/07/2016 0432   CREATININE 2.27 (H) 01/12/2016 1509   CALCIUM 8.7 (L) 08/07/2016 0432   PROT 6.1 (L) 08/05/2016 0418   ALBUMIN 3.5 08/05/2016 0418   AST 19 08/05/2016 0418   ALT 16 (L) 08/05/2016 0418   ALKPHOS 86 08/05/2016 0418   BILITOT 0.8 08/05/2016 0418   GFRNONAA 31 (L) 08/07/2016 [id number]  GFRAA 36 (L) 08/07/2016 0432    GFR Estimated Creatinine Clearance: 33.1 mL/min (by C-G  formula based on SCr of 1.93 mg/dL (H)). No results for input(s): LIPASE, AMYLASE in the last 72 hours. No results for input(s): CKTOTAL, CKMB, CKMBINDEX, TROPONINI in the last 72 hours. Invalid input(s): POCBNP No results for input(s): DDIMER in the last 72 hours. No results for input(s): HGBA1C in the last 72 hours. No results for input(s): CHOL, HDL, LDLCALC, TRIG, CHOLHDL, LDLDIRECT in the last 72 hours. No results for input(s): TSH, T4TOTAL, T3FREE, THYROIDAB in the last 72 hours.  Invalid input(s): FREET3 No results for input(s): VITAMINB12, FOLATE, FERRITIN, TIBC, IRON, RETICCTPCT in the last 72 hours. Coags:  Recent Labs  08/08/16 0532 08/09/16 0237  INR 1.19 1.63   Microbiology: Recent Results (from the past 240 hour(s))  MRSA PCR Screening     Status: None   Collection Time: 08/04/16  7:00 AM  Result Value Ref Range Status   MRSA by PCR NEGATIVE NEGATIVE Final    Comment:        The GeneXpert MRSA Assay (FDA approved for NASAL specimens only), is one component of a comprehensive MRSA colonization surveillance program. It is not intended to diagnose MRSA infection nor to guide or monitor treatment for MRSA infections.     FURTHER DISCHARGE INSTRUCTIONS:  Get Medicines reviewed and adjusted: Please take all your medications with you for your next visit with your Primary MD  Laboratory/radiological data: Please request your Primary MD to go over all hospital tests and procedure/radiological results at the follow up, please ask your Primary MD to get all Hospital records sent to his/her office.  In some cases, they will be blood work, cultures and biopsy results pending at the time of your discharge. Please request that your primary care M.D. goes through all the records of your hospital data and follows up on these results.  Also Note the following: If you experience worsening of your admission symptoms, develop shortness of breath, life threatening emergency,  suicidal or homicidal thoughts you must seek medical attention immediately by calling 911 or calling your MD immediately  if symptoms less severe.  You must read complete instructions/literature along with all the possible adverse reactions/side effects for all the Medicines you take and that have been prescribed to you. Take any new Medicines after you have completely understood and accpet all the possible adverse reactions/side effects.   Do not drive when taking Pain medications or sleeping medications (Benzodaizepines)  Do not take more than prescribed Pain, Sleep and Anxiety Medications. It is not advisable to combine anxiety,sleep and pain medications without talking with your primary care practitioner  Special Instructions: If you have smoked or chewed Tobacco  in the last 2 yrs please stop smoking, stop any regular Alcohol  and or any Recreational drug use.  Wear Seat belts while driving.  Please note: You were cared for by a hospitalist during your hospital stay. Once you are discharged, your primary care physician will handle any further medical issues. Please note that NO REFILLS for any discharge medications will be authorized once you are discharged, as it is imperative that you return to your primary care physician (or establish a relationship with a primary care physician if you do not have one) for your post hospital discharge needs so that they can reassess your need for medications and monitor your lab values.  Total Time spent coordinating discharge including counseling, education and face to face time equals  45 minutes.  SignedOren Binet 08/09/2016 11:09 AM

## 2016-08-10 ENCOUNTER — Telehealth: Payer: Self-pay | Admitting: Physician Assistant

## 2016-08-10 ENCOUNTER — Encounter: Payer: Self-pay | Admitting: Physician Assistant

## 2016-08-10 NOTE — Telephone Encounter (Signed)
Patient called stating he did not have a coumadin prescription. Review of his chart showed he should be taking coumadin 5 mg nightly. I called patient back and he states he found the prescription and will get it filled tonight. He confirmed that he has an INR check on Monday. He had no further questions.  Tami Lin Emmalin Jaquess PA-C

## 2016-08-21 ENCOUNTER — Ambulatory Visit (INDEPENDENT_AMBULATORY_CARE_PROVIDER_SITE_OTHER): Payer: Medicare Other | Admitting: Physician Assistant

## 2016-08-21 ENCOUNTER — Encounter: Payer: Self-pay | Admitting: Physician Assistant

## 2016-08-21 VITALS — BP 120/70 | HR 76 | Ht 71.5 in | Wt 202.0 lb

## 2016-08-21 DIAGNOSIS — I110 Hypertensive heart disease with heart failure: Secondary | ICD-10-CM

## 2016-08-21 DIAGNOSIS — N183 Chronic kidney disease, stage 3 unspecified: Secondary | ICD-10-CM

## 2016-08-21 DIAGNOSIS — E785 Hyperlipidemia, unspecified: Secondary | ICD-10-CM | POA: Diagnosis not present

## 2016-08-21 DIAGNOSIS — I2510 Atherosclerotic heart disease of native coronary artery without angina pectoris: Secondary | ICD-10-CM

## 2016-08-21 DIAGNOSIS — I209 Angina pectoris, unspecified: Secondary | ICD-10-CM

## 2016-08-21 DIAGNOSIS — I481 Persistent atrial fibrillation: Secondary | ICD-10-CM | POA: Diagnosis not present

## 2016-08-21 DIAGNOSIS — I5042 Chronic combined systolic (congestive) and diastolic (congestive) heart failure: Secondary | ICD-10-CM

## 2016-08-21 DIAGNOSIS — I25119 Atherosclerotic heart disease of native coronary artery with unspecified angina pectoris: Secondary | ICD-10-CM

## 2016-08-21 DIAGNOSIS — I4819 Other persistent atrial fibrillation: Secondary | ICD-10-CM

## 2016-08-21 MED ORDER — AMIODARONE HCL 200 MG PO TABS: 200.0000 mg | ORAL_TABLET | Freq: Every day | ORAL | 3 refills | Status: AC

## 2016-08-21 MED ORDER — CLOPIDOGREL BISULFATE 75 MG PO TABS: 75.0000 mg | ORAL_TABLET | Freq: Every day | ORAL | 3 refills | Status: DC

## 2016-08-21 MED ORDER — ISOSORBIDE MONONITRATE ER 30 MG PO TB24
30.0000 mg | ORAL_TABLET | Freq: Every day | ORAL | 3 refills | Status: AC
Start: 2016-08-21 — End: ?

## 2016-08-21 NOTE — Patient Instructions (Addendum)
Medication Instructions:  1. DECREASE AMIODARONE TO 200 MG ONCE A DAY 2. REFILLS SENT IN FOR PLAVIX AND ISOSORBIDE  Labwork: TODAY BMET  Testing/Procedures: Your physician has requested that you have an echocardiogram. Echocardiography is a painless test that uses sound waves to create images of your heart. It provides your doctor with information about the size and shape of your heart and how well your heart's chambers and valves are working. This procedure takes approximately one hour. There are no restrictions for this procedure. TO BE DONE LATE MAY 1-2 DAYS BEFORE APPT WITH DR. Burt Knack  Follow-Up: DR. Burt Knack LATE MAY 2018 AFTER ECHO HAS BEEN COMPLETED  Any Other Special Instructions Will Be Listed Below (If Applicable).  If you need a refill on your cardiac medications before your next appointment, please call your pharmacy.

## 2016-08-21 NOTE — Progress Notes (Signed)
Cardiology Office Note:    Date:  08/21/2016   ID:  Keith Church, DOB Apr 25, 1935, MRN [id number]  PCP:  Halford Decamp, MD  Cardiologist:  Dr. Sherren Mocha   Electrophysiologist:  n/a  Referring MD: [facility]   Chief Complaint  Patient presents with  . Hospitalization Follow-up    Atrial fibrillation, CHF, elevated troponin    History of Present Illness:    Keith Church is a 82 y.o. male with a hx of CAD s/p Cypher DES to mLAD in 2007 and Promus DES to proximal LAD in 6/17, ischemic CM, CKD, HTN, HL, DM2, prostate CA.  Last seen by Dr. Sherren Mocha in 10/17.  Repeat Echo was arranged to reassess LVF.  This demonstrated improved ejection fraction at 40-45.    Admitted 2/17-2/22 with an elevated Troponin level in the setting of AF with RVR. Echo demonstrated worsening LVF with EF 20-25.  He underwent LHC which demonstrated patent stents in the LAD and 90% stenosis in the smaller OM side branches. Med Rx was continued.  He was switched from Brilinta to Plavix and started on Coumadin.  He went back into NSR but continued to have episodes of AFib.  He was placed on Amiodarone.  He returns for follow up.   CHADS2-VASc=6 (82 yo, CAD, CHF, DM, HTN).  He is here alone. His PCP in [place], [place] is managing his Coumadin. Recent INR was 6.1 and his Coumadin was held for 2 days. Patient denies palpitations. He denies chest discomfort or significant dyspnea. He denies orthopnea, PND or edema. He denies syncope or near-syncope. He has had some epistaxis but denies any melena, hematochezia or hematuria.   Prior CV studies:   The following studies were reviewed today:  LHC 08/06/16 LM irregularities LAD proximal stent patent, mid stent patent with 30-50 ISR LCx with lateral OM2 90 RCA mid 30, 40 1. CAD with patent stent at the origin of the LAD. The stent in the mid LAD has moderate diffuse in stent disease. The LCx gives rise to a single large OM branch that  trifurcates. There is 90% stenosis of both smaller side branches. 2. Normal LV EDP  Echo 08/04/16 EF 20-25, diffuse HK, grade 2 diastolic function, LVEDP of at least 20, mild MR, mild to moderate LAE, mildly reduced RVSF, PASP 38  Limited echo 10/17 EF 40-45, anteroseptal, anterior, apical HK  Echo 7/17 EF 30-35, inferior and apical HK, grade 1 diastolic dysfunction  PCI 7/17 3.5 x 12 Promus Premier DES to prox LAD  Myoview 7/17 EF 27, NSVT, apical defect, high risk  Past Medical History:  Diagnosis Date  . CAD (coronary artery disease) 2007   1. s/p Cypher DES to LAD in 2007 // 2. s/p Promus DES to pLAD in 6/17 // 3. admx with elevated Trop in setting of AF with RVR >> LHC 2/18: pLAD stent ok, mLAD stent ok with 30-50 ISR, lat OM2 90 (small), mRCA 30, 40  . Chronic combined systolic and diastolic CHF (congestive heart failure) ([place])    1. Echo 7/17: EF 30-35, inferior and apical HK, grade 1 diastolic dysfunction // 2. Echo 10/17 (after PCI): EF 40-45, ant-septal, ant, apical HK // 3. during admit with AF with RVR and elev Trop >> Echo 2/18: EF 20-25, diffuse HK, grade 2 diastolic function, LVEDP of at least 20, mild MR, mild to moderate LAE, mildly reduced RVSF, PASP   . Chronic kidney disease (CKD), stage III (  moderate)   . History of nuclear stress test    Myoview 7/17: EF 27, NSVT, apical defect, high risk  . HTN (hypertension)   . Hyperlipidemia   . Hypertensive heart disease with CHF (congestive heart failure) ([place]) 08/17/2008   Qualifier: Diagnosis of  By: Burt Knack, MD, [place] Pert   . Ischemic cardiomyopathy   . Kidney stones   . Persistent atrial fibrillation (HCC)    CHADS2-VASc=6 // Coumadin started 07/2016 (managed by PCP at [facility], [place])  . Prostate cancer ([place]) ~ 2016  . Type II diabetes mellitus ([place])     Past Surgical History:  Procedure Laterality Date  . CARDIAC CATHETERIZATION  1990s   "no stent"  . CARDIAC CATHETERIZATION N/A 01/05/2016   Procedure: Left  Heart Cath and Coronary Angiography;  Surgeon: Sherren Mocha, MD;  Location: [facility];  Service: Cardiovascular;  Laterality: N/A;  . CARDIAC CATHETERIZATION N/A 01/05/2016   Procedure: Coronary Stent Intervention;  Surgeon: Sherren Mocha, MD;  Location: [facility];  Service: Cardiovascular;  Laterality: N/A;  . CATARACT EXTRACTION W/ INTRAOCULAR LENS  IMPLANT, BILATERAL Bilateral   . CORONARY ANGIOPLASTY WITH STENT PLACEMENT  2007   drug-eluting stent to treat severe lad stenosis  . CORONARY ANGIOPLASTY WITH STENT PLACEMENT  01/05/2016  . CYSTOSCOPY W/ STONE MANIPULATION  07/2015  . LAPAROSCOPIC CHOLECYSTECTOMY    . LEFT HEART CATH AND CORONARY ANGIOGRAPHY N/A 08/06/2016   Procedure: Left Heart Cath and Coronary Angiography;  Surgeon: Peter M Martinique, MD;  Location: [facility];  Service: Cardiovascular;  Laterality: N/A;  . PROSTATE BIOPSY  ~ 2016    Current Medications: Current Meds  Medication Sig  . acetaminophen (TYLENOL) 325 MG tablet Take 325-650 mg by mouth daily as needed for mild pain.   Marland Kitchen atorvastatin (LIPITOR) 40 MG tablet Take 1 tablet (40 mg total) by mouth daily.  . cetirizine (ZYRTEC) 10 MG tablet Take 10 mg by mouth daily as needed for allergies.  Marland Kitchen clopidogrel (PLAVIX) 75 MG tablet Take 1 tablet (75 mg total) by mouth daily.  Marland Kitchen co-enzyme Q-10 50 MG capsule Take 50 mg by mouth daily.  Marland Kitchen epoetin alfa (EPOGEN,PROCRIT) 60454 UNIT/ML injection Inject 10,000 Units into the skin every 6 (six) weeks. If needed.  Patient taking subcutaneous injection once monthly as directed by his Cancer Doctor. Doesn't recall dosage.  . fluticasone (FLONASE) 50 MCG/ACT nasal spray Place 1-2 sprays into both nostrils daily as needed for allergies.   Marland Kitchen glipiZIDE (GLUCOTROL) 10 MG tablet Take 10 mg by mouth 2 (two) times daily before a meal.  . insulin detemir (LEVEMIR) 100 UNIT/ML injection Inject 0.22 mLs (22 Units total) into the skin 2 (two) times daily.  . isosorbide  mononitrate (IMDUR) 30 MG 24 hr tablet Take 1 tablet (30 mg total) by mouth daily.  Marland Kitchen linagliptin (TRADJENTA) 5 MG TABS tablet Take 5 mg by mouth daily.   . metoprolol succinate (TOPROL-XL) 50 MG 24 hr tablet Take 1 tablet (50 mg total) by mouth daily. Take with or immediately following a meal.  . nitroGLYCERIN (NITROSTAT) 0.4 MG SL tablet Place 1 tablet (0.4 mg total) under the tongue every 5 (five) minutes as needed for chest pain.  . simethicone (MYLICON) 0000000 MG chewable tablet Chew 125 mg by mouth every 6 (six) hours as needed for flatulence.  . vitamin C (ASCORBIC ACID) 500 MG tablet Take 500 mg by mouth daily.  . Vitamin D, Ergocalciferol, (DRISDOL) 50000 units CAPS capsule Take  50,000 Units by mouth every Sunday.   . [DISCONTINUED] amiodarone (PACERONE) 200 MG tablet Take 2 tablets (400 mg total) by mouth 2 (two) times daily.  . [DISCONTINUED] clopidogrel (PLAVIX) 75 MG tablet Take 1 tablet (75 mg total) by mouth daily.  . [DISCONTINUED] isosorbide mononitrate (IMDUR) 30 MG 24 hr tablet Take 1 tablet (30 mg total) by mouth daily.     Allergies:   Patient has no known allergies.   Social History   Social History  . Marital status: Married    Spouse name: N/A  . Number of children: N/A  . Years of education: N/A   Occupational History  . retired    Social History Main Topics  . Smoking status: Never Smoker  . Smokeless tobacco: Former Systems developer    Types: Chew     Comment: "quit chewing by the age of 65"  . Alcohol use No  . Drug use: No  . Sexual activity: No   Other Topics Concern  . None   Social History Narrative  . None     Family History  Problem Relation Age of Onset  . Heart disease Mother   . Stroke Father   . Coronary artery disease       ROS:   Please see the history of present illness.    ROS All other systems reviewed and are negative.   EKGs/Labs/Other Test Reviewed:    EKG:  EKG is  ordered today.  The ekg ordered today demonstrates NSR, HR 76,  inferior Q waves, LAD, PACs, first-degree AV block, PR 228, QTC 499  Recent Labs: 08/04/2016: Magnesium 1.9; TSH 2.021 08/05/2016: ALT 16 08/07/2016: BUN 45; Creatinine, Ser 1.93; Potassium 4.7; Sodium 139 08/09/2016: Hemoglobin 11.2; Platelets 135   Recent Lipid Panel    Component Value Date/Time   CHOL 106 08/05/2016 0418   TRIG 88 08/05/2016 0418   HDL 38 (L) 08/05/2016 0418   CHOLHDL 2.8 08/05/2016 0418   VLDL 18 08/05/2016 0418   LDLCALC 50 08/05/2016 0418     Physical Exam:    VS:  BP 120/70 (BP Location: Left Arm, Patient Position: Sitting, Cuff Size: Normal)   Pulse 76   Ht 5' 11.5" (1.816 m)   Wt 202 lb (91.6 kg)   BMI 27.78 kg/m     Wt Readings from Last 3 Encounters:  08/21/16 202 lb (91.6 kg)  08/09/16 188 lb 11.2 oz (85.6 kg)  04/13/16 198 lb (89.8 kg)     Physical Exam  Constitutional: He is oriented to person, place, and time. He appears well-developed and well-nourished. No distress.  HENT:  Head: Normocephalic.  Eyes: No scleral icterus.  Neck: No JVD present.  Cardiovascular: Normal rate.  An irregular rhythm present.  No murmur heard. Pulmonary/Chest: Effort normal. He has no wheezes. He has no rales.  Abdominal: Soft. There is no tenderness.  Musculoskeletal: He exhibits no edema.  R wrist without hematoma  Neurological: He is alert and oriented to person, place, and time.  Skin: Skin is warm and dry.  Psychiatric: He has a normal mood and affect.    ASSESSMENT:    1. Persistent atrial fibrillation ([place])   2. Chronic combined systolic and diastolic CHF (congestive heart failure) ([place])   3. Coronary artery disease involving native coronary artery of native heart without angina pectoris   4. Hypertensive heart disease with CHF (congestive heart failure) ([place])   5. Hyperlipidemia, unspecified hyperlipidemia type   6. Chronic kidney disease (CKD), stage III (  moderate)   7. Coronary artery disease involving native coronary artery of native heart  with angina pectoris ([place])    PLAN:    In order of problems listed above:  1. Persistent atrial fibrillation (HCC) - Maintaining NSR.  He has been on high-dose amiodarone for more than 2 weeks. His Coumadin is managed by primary care. He has had difficulty with elevated INRs recently but denies any bleeding.  -  Decrease amiodarone to 200 mg daily  -  Continue follow-up with PCP for management of Coumadin  2. Chronic combined systolic and diastolic CHF (congestive heart failure) (HCC) - Volume is stable. NYHA 2. He is not on ACE inhibitor or ARB secondary to chronic kidney disease. Continue beta blocker, nitrates. BP at home is 120 or less. I will hold off on adding hydralazine at this time. I suspect his worsening LV function was related to tachycardia. He will need a repeat echocardiogram in 3 months.  -  Arrange echo in late May 2018 with FU with Dr. Burt Knack  3. Coronary artery disease involving native coronary artery of native heart without angina pectoris - status post prior PCI to the LAD in 2007 and DES to the LAD in 2017. Recent heart catheterization with patent LAD stents. Brilinta was changed to Plavix secondary to initiation of Coumadin. Continue Plavix, isosorbide, beta blocker, statin.  4. Hypertensive heart disease with CHF (congestive heart failure) ([place]) - Blood pressure is controlled.  5. Hyperlipidemia, unspecified hyperlipidemia type - Continue statin.  6. Chronic kidney disease (CKD), stage III (moderate) - Repeat BMET today.  Dispo:  Return in about 10 weeks (around 10/30/2016) for Routine Follow Up, Follow up after testing, w/ Dr. Burt Knack.   Medication Adjustments/Labs and Tests Ordered: Current medicines are reviewed at length with the patient today.  Concerns regarding medicines are outlined above.  Medication changes, Labs and Tests ordered today are outlined in the Patient Instructions noted below. Patient Instructions  Medication Instructions:  1. DECREASE  AMIODARONE TO 200 MG ONCE A DAY 2. REFILLS SENT IN FOR PLAVIX AND ISOSORBIDE  Labwork: TODAY BMET  Testing/Procedures: Your physician has requested that you have an echocardiogram. Echocardiography is a painless test that uses sound waves to create images of your heart. It provides your doctor with information about the size and shape of your heart and how well your heart's chambers and valves are working. This procedure takes approximately one hour. There are no restrictions for this procedure. TO BE DONE LATE MAY 1-2 DAYS BEFORE APPT WITH DR. Burt Knack  Follow-Up: DR. Burt Knack LATE MAY 2018 AFTER ECHO HAS BEEN COMPLETED  Any Other Special Instructions Will Be Listed Below (If Applicable).  If you need a refill on your cardiac medications before your next appointment, please call your pharmacy.   Signed, Richardson Dopp, PA-C  08/21/2016 4:43 PM    [facility] [address], [place], [place]  [address] Phone: [phone]; Fax: [phone]

## 2016-08-22 ENCOUNTER — Telehealth: Payer: Self-pay | Admitting: *Deleted

## 2016-08-22 LAB — BASIC METABOLIC PANEL
BUN / CREAT RATIO: 16 (ref 10–24)
BUN: 36 mg/dL — ABNORMAL HIGH (ref 8–27)
CO2: 20 mmol/L (ref 18–29)
Calcium: 8.8 mg/dL (ref 8.6–10.2)
Chloride: 110 mmol/L — ABNORMAL HIGH (ref 96–106)
Creatinine, Ser: 2.2 mg/dL — ABNORMAL HIGH (ref 0.76–1.27)
GFR, EST AFRICAN AMERICAN: 32 mL/min/{1.73_m2} — AB (ref >59)
GFR, EST NON AFRICAN AMERICAN: 27 mL/min/{1.73_m2} — AB (ref >59)
Glucose: 196 mg/dL — ABNORMAL HIGH (ref 65–99)
POTASSIUM: 5.4 mmol/L — AB (ref 3.5–5.2)
SODIUM: 144 mmol/L (ref 134–144)

## 2016-08-22 NOTE — Telephone Encounter (Signed)
Pt notified of lab results by phone with verbal understanding. Pt would like to have repeat  BMET in 1 week with PCP. I will fax lab order to PCP today.

## 2016-08-28 ENCOUNTER — Telehealth: Payer: Self-pay | Admitting: *Deleted

## 2016-08-28 NOTE — Telephone Encounter (Signed)
Pt notified of lab results by phone with verbal understanding.  

## 2016-09-27 ENCOUNTER — Encounter: Payer: Self-pay | Admitting: *Deleted

## 2016-09-27 DIAGNOSIS — Z006 Encounter for examination for normal comparison and control in clinical research program: Secondary | ICD-10-CM

## 2016-09-27 NOTE — Progress Notes (Signed)
TWILIGHT Research study month 9 telephone follow up completed. Patient is NOT on study drug however, he continues in the follow up for study. He is taking coumadin and Plavix and has not experienced any bleeding events. Patient continues to stay in telephone follow up for 2 more calls. He should complete all follow no later than 25/JAN/2019.

## 2016-10-24 ENCOUNTER — Encounter: Payer: Self-pay | Admitting: Cardiovascular Disease

## 2016-11-14 ENCOUNTER — Ambulatory Visit (HOSPITAL_COMMUNITY): Payer: Medicare Other | Attending: Cardiovascular Disease

## 2016-11-14 ENCOUNTER — Encounter: Payer: Self-pay | Admitting: Physician Assistant

## 2016-11-14 ENCOUNTER — Other Ambulatory Visit: Payer: Self-pay

## 2016-11-14 VITALS — BP 164/94

## 2016-11-14 DIAGNOSIS — I340 Nonrheumatic mitral (valve) insufficiency: Secondary | ICD-10-CM | POA: Diagnosis not present

## 2016-11-14 DIAGNOSIS — I110 Hypertensive heart disease with heart failure: Secondary | ICD-10-CM | POA: Diagnosis not present

## 2016-11-14 DIAGNOSIS — I481 Persistent atrial fibrillation: Secondary | ICD-10-CM | POA: Insufficient documentation

## 2016-11-14 DIAGNOSIS — I5042 Chronic combined systolic (congestive) and diastolic (congestive) heart failure: Secondary | ICD-10-CM

## 2016-11-14 DIAGNOSIS — I2510 Atherosclerotic heart disease of native coronary artery without angina pectoris: Secondary | ICD-10-CM | POA: Diagnosis not present

## 2016-11-14 DIAGNOSIS — I4819 Other persistent atrial fibrillation: Secondary | ICD-10-CM

## 2016-11-14 LAB — ECHOCARDIOGRAM COMPLETE

## 2016-11-14 MED ORDER — PERFLUTREN LIPID MICROSPHERE
1.0000 mL | INTRAVENOUS | Status: AC | PRN
  Administered 2016-11-14: 2 mL via INTRAVENOUS

## 2016-11-15 ENCOUNTER — Ambulatory Visit (INDEPENDENT_AMBULATORY_CARE_PROVIDER_SITE_OTHER): Payer: Medicare Other | Admitting: Cardiovascular Disease

## 2016-11-15 ENCOUNTER — Encounter: Payer: Self-pay | Admitting: Cardiovascular Disease

## 2016-11-15 ENCOUNTER — Other Ambulatory Visit (HOSPITAL_COMMUNITY): Payer: Medicare Other

## 2016-11-15 VITALS — BP 122/80 | HR 62 | Ht 71.5 in | Wt 206.8 lb

## 2016-11-15 DIAGNOSIS — E785 Hyperlipidemia, unspecified: Secondary | ICD-10-CM | POA: Diagnosis not present

## 2016-11-15 DIAGNOSIS — I5042 Chronic combined systolic (congestive) and diastolic (congestive) heart failure: Secondary | ICD-10-CM

## 2016-11-15 DIAGNOSIS — I255 Ischemic cardiomyopathy: Secondary | ICD-10-CM

## 2016-11-15 DIAGNOSIS — N183 Chronic kidney disease, stage 3 unspecified: Secondary | ICD-10-CM

## 2016-11-15 DIAGNOSIS — I25119 Atherosclerotic heart disease of native coronary artery with unspecified angina pectoris: Secondary | ICD-10-CM

## 2016-11-15 DIAGNOSIS — I209 Angina pectoris, unspecified: Secondary | ICD-10-CM

## 2016-11-15 NOTE — Patient Instructions (Signed)
Medication Instructions:  Your physician recommends that you continue on your current medications as directed. Please refer to the Current Medication list given to you today.  Labwork: Your physician recommends that you return for lab work in: 4 MONTHS (CMP and TSH)  Testing/Procedures: No new orders.   Follow-Up: Your physician recommends that you schedule a follow-up appointment in: 4 MONTHS with Dr Burt Knack   Any Other Special Instructions Will Be Listed Below (If Applicable).     If you need a refill on your cardiac medications before your next appointment, please call your pharmacy.

## 2016-11-15 NOTE — Progress Notes (Signed)
Cardiology Office Note Date:  11/15/2016   ID:  Keith Church, DOB 02/08/1935, MRN [id number]  PCP:  Halford Decamp, MD  Cardiologist:  Sherren Mocha, MD    Chief Complaint  Patient presents with  . Coronary Artery Disease    follow-up   History of Present Illness: Keith Church is a 82 y.o. male who presents for follow-up of coronary artery disease and chronic systolic heart failure. The patient has multiple comorbidities including chronic kidney disease, hypertension, hyperlipidemia, type 2 diabetes, and prostate cancer[place]. He has been followed in our practice for many years.  The patient initially underwent PCI in 2007 with a Cypher DES in the LAD. He presented with progressive symptoms of angina in 2017 and was found to have a high risk nuclear scan with new cardiomyopathy. He underwent catheterization demonstrating critical proximal LAD stenosis and he was treated with PCI. He was hospitalized in February 2018 with atrial fibrillation with RVR. His LV function worsened and his ejection fraction at that time was estimated at 20-25%. Repeat catheterization demonstrated patency of his LAD stent and medical therapy was recommended for small vessel disease. He was started on warfarin and his antiplatelet therapy was changed to clopidogrel. He has recently undergone a follow-up echocardiogram and presents today for clinical follow-up.  Patient is doing fairly well. He has some shortness of breath with moderate level activities. He walks with his wife 1 mile every day for about 20 minutes without stopping to rest. He plays 18 holes of golf twice per week and states that he has some fatigue and shortness of breath when he has to stay on a cart path only. Otherwise he has no symptoms. He denies orthopnea, PND, or any recurrence of chest pain. He's had no palpitations, lightheadedness, or syncope.   Past Medical History:  Diagnosis  Date  . CAD (coronary artery disease) 2007   1. s/p Cypher DES to LAD in 2007 // 2. s/p Promus DES to pLAD in 6/17 // 3. admx with elevated Trop in setting of AF with RVR >> LHC 2/18: pLAD stent ok, mLAD stent ok with 30-50 ISR, lat OM2 90 (small), mRCA 30, 40  . Chronic combined systolic and diastolic CHF (congestive heart failure) ([place])    1. Echo 7/17: EF 30-35, inferior and apical HK, grade 1 diastolic dysfunction // 2. Echo 10/17 (after PCI): EF 40-45, ant-septal, ant, apical HK // 3.admit with AF w/ RVR + elev Trop >> Echo 2/18: EF 20-25, diff HK, grade 2 diast fn; LVEDP ~ 20, mild MR, mild to mod LAE, mild reduced RVSF, PASP // 4. Echo 10/2016: EF 20-25, diffuse HK, normal diastolic function, mild MR, mild LAE, PASP 40   . Chronic kidney disease (CKD), stage III (moderate)   . History of nuclear stress test    Myoview 7/17: EF 27, NSVT, apical defect, high risk  . HTN (hypertension)   . Hyperlipidemia   . Hypertensive heart disease with CHF (congestive heart failure) ([place]) 08/17/2008   Qualifier: Diagnosis of  By: Burt Knack, MD, [place]burn Pert   . Ischemic cardiomyopathy   . Kidney stones   . Persistent atrial fibrillation (HCC)    CHADS2-VASc=6 // Coumadin started 07/2016 (managed by PCP at [facility], [place])  . Prostate cancer ([place]) ~ 2016  . Type II diabetes mellitus ([place])     Past Surgical History:  Procedure Laterality Date  . CARDIAC CATHETERIZATION  1990s   "no stent"  . CARDIAC CATHETERIZATION N/A 01/05/2016   Procedure: Left Heart Cath and Coronary Angiography;  Surgeon: Sherren Mocha, MD;  Location: [facility];  Service: Cardiovascular;  Laterality: N/A;  . CARDIAC CATHETERIZATION N/A 01/05/2016   Procedure: Coronary Stent Intervention;  Surgeon: Sherren Mocha, MD;  Location: [facility];  Service: Cardiovascular;  Laterality: N/A;  . CATARACT EXTRACTION W/ INTRAOCULAR LENS  IMPLANT, BILATERAL Bilateral   . CORONARY ANGIOPLASTY WITH STENT PLACEMENT  2007    drug-eluting stent to treat severe lad stenosis  . CORONARY ANGIOPLASTY WITH STENT PLACEMENT  01/05/2016  . CYSTOSCOPY W/ STONE MANIPULATION  07/2015  . LAPAROSCOPIC CHOLECYSTECTOMY    . LEFT HEART CATH AND CORONARY ANGIOGRAPHY N/A 08/06/2016   Procedure: Left Heart Cath and Coronary Angiography;  Surgeon: Peter M Martinique, MD;  Location: [facility];  Service: Cardiovascular;  Laterality: N/A;  . PROSTATE BIOPSY  ~ 2016    Current Outpatient Prescriptions  Medication Sig Dispense Refill  . acetaminophen (TYLENOL) 325 MG tablet Take 325-650 mg by mouth daily as needed for mild pain.     Marland Kitchen amiodarone (PACERONE) 200 MG tablet Take 1 tablet (200 mg total) by mouth daily. 90 tablet 3  . atorvastatin (LIPITOR) 40 MG tablet Take 1 tablet (40 mg total) by mouth daily. 90 tablet 3  . cetirizine (ZYRTEC) 10 MG tablet Take 10 mg by mouth daily as needed for allergies.    Marland Kitchen clopidogrel (PLAVIX) 75 MG tablet Take 1 tablet (75 mg total) by mouth daily. 90 tablet 3  . epoetin alfa (EPOGEN,PROCRIT) 94496 UNIT/ML injection Inject 10,000 Units into the skin every 6 (six) weeks. If needed.  Patient taking subcutaneous injection once monthly as directed by his Cancer Doctor. Doesn't recall dosage.    . fluticasone (FLONASE) 50 MCG/ACT nasal spray Place 1-2 sprays into both nostrils daily as needed for allergies.     Marland Kitchen glipiZIDE (GLUCOTROL) 10 MG tablet Take 10 mg by mouth 2 (two) times daily before a meal.    . insulin detemir (LEVEMIR) 100 UNIT/ML injection Inject 0.22 mLs (22 Units total) into the skin 2 (two) times daily.    . isosorbide mononitrate (IMDUR) 30 MG 24 hr tablet Take 1 tablet (30 mg total) by mouth daily. 90 tablet 3  . linagliptin (TRADJENTA) 5 MG TABS tablet Take 5 mg by mouth daily.     . metoprolol succinate (TOPROL-XL) 50 MG 24 hr tablet Take 1 tablet (50 mg total) by mouth daily. Take with or immediately following a meal. 90 tablet 3  . nitroGLYCERIN (NITROSTAT) 0.4 MG SL tablet Place 1  tablet (0.4 mg total) under the tongue every 5 (five) minutes as needed for chest pain. 25 tablet 3  . simethicone (MYLICON) 759 MG chewable tablet Chew 125 mg by mouth every 6 (six) hours as needed for flatulence.    . vitamin C (ASCORBIC ACID) 500 MG tablet Take 500 mg by mouth daily.    . Vitamin D, Ergocalciferol, (DRISDOL) 50000 units CAPS capsule Take 50,000 Units by mouth every Sunday.     . warfarin (COUMADIN) 1 MG tablet Take as directed by the [facility] (Also has 2 mg tablets)    . warfarin (COUMADIN) 2 MG tablet Take as directed by the [facility]     No current facility-administered medications for this visit.     Allergies:   Patient has no known allergies.   Social History:  The patient  reports that he has never smoked. He has quit using smokeless tobacco. His smokeless tobacco use included Chew. He reports that he does not drink alcohol or use drugs.   Family History:  The patient's  family history includes Heart disease in his mother; Stroke in his father.    ROS:  Please see the history of present illness.  Otherwise, review of systems is positive for fatigue, easy bruising, left leg swelling.  All other systems are reviewed and negative.    PHYSICAL EXAM: VS:  BP 122/80   Pulse 62   Ht 5' 11.5" (1.816 m)   Wt 206 lb 12.8 oz (93.8 kg)   SpO2 96%   BMI 28.44 kg/m  , BMI Body mass index is 28.44 kg/m. GEN: Well nourished, well developed, in no acute distress  HEENT: normal  Neck: no JVD, no masses. No carotid bruits Cardiac: RRR without murmur or gallop                Respiratory:  clear to auscultation bilaterally, normal work of breathing GI: soft, nontender, nondistended, + BS MS: no deformity or atrophy  Ext: no pretibial edema, pedal pulses 2+= bilaterally Skin: warm and dry, no rash Neuro:  Strength and sensation are intact Psych: euthymic mood, full affect  EKG:  EKG is not ordered today.  Recent Labs: 08/04/2016: Magnesium 1.9; TSH  2.021 08/05/2016: ALT 16 08/09/2016: Hemoglobin 11.2; Platelets 135 08/21/2016: BUN 36; Creatinine, Ser 2.20; Potassium 5.4; Sodium 144   Lipid Panel     Component Value Date/Time   CHOL 106 08/05/2016 0418   TRIG 88 08/05/2016 0418   HDL 38 (L) 08/05/2016 0418   CHOLHDL 2.8 08/05/2016 0418   VLDL 18 08/05/2016 0418   LDLCALC 50 08/05/2016 0418      Wt Readings from Last 3 Encounters:  11/15/16 206 lb 12.8 oz (93.8 kg)  08/21/16 202 lb (91.6 kg)  08/09/16 188 lb 11.2 oz (85.6 kg)     Cardiac Studies Reviewed: 2D Echo 11/14/2016: Left ventricle:  The cavity size was moderately dilated. Wall thickness was normal. Systolic function was severely reduced. The estimated ejection fraction was in the range of 20% to 25%. Diffuse hypokinesis. The transmitral flow pattern was normal. The deceleration time of the early transmitral flow velocity was normal. The pulmonary vein flow pattern was normal. The tissue Doppler parameters were normal. Left ventricular diastolic function parameters were normal.  ------------------------------------------------------------------- Aortic valve:   Trileaflet; mildly calcified leaflets.  Doppler: There was no stenosis.  ------------------------------------------------------------------- Aorta:  The aorta was normal, not dilated, and non-diseased.  ------------------------------------------------------------------- Mitral valve:   Mildly thickened leaflets .  Doppler:  There was mild regurgitation.    Peak gradient (D): 5 mm Hg.  ------------------------------------------------------------------- Left atrium:  The atrium was mildly dilated.  ------------------------------------------------------------------- Atrial septum:  No defect or patent foramen ovale was identified.   ------------------------------------------------------------------- Right ventricle:  The cavity size was normal. Wall thickness was normal. Systolic function was  normal.  ------------------------------------------------------------------- Pulmonic valve:    Doppler:  There was trivial regurgitation.  ------------------------------------------------------------------- Tricuspid valve:   Doppler:  There was mild regurgitation.  ------------------------------------------------------------------- Right atrium:  The atrium was normal in size.  ------------------------------------------------------------------- Pericardium:  The pericardium was normal in appearance.  ASSESSMENT AND PLAN: 1.  Coronary artery disease, native vessel, without angina: Most recent cardiac catheterization study reviewed. Patient will continue on antiplatelet therapy with clopidogrel. He remains on isosorbide and metoprolol succinate.  2. Chronic systolic heart failure, [place]  Heart Association functional class II: The patient is not a candidate for ACE or ARB because of advanced kidney disease. He continues on metoprolol succinate and isosorbide. He remains minimally symptomatic with his normal activities. His echocardiogram is reviewed and his ejection fraction is less than 30%. We discussed consideration of EP referral for discussion of an ICD. I reviewed the pros and cons of this, specifically discussed considerations regarding his advanced age and other comorbid medical conditions. He prefers to stay with medical therapy and is not interested in considering an ICD at this time. His last EKG is reviewed and his QRS duration would not qualify him for cardiac resynchronization.  3. Paroxysmal atrial fibrillation: Patient is maintaining sinus rhythm on amiodarone. When he returns in 4 months we'll check a TSH and metabolic panel. He continues with anticoagulation using warfarin.  4. Stage III chronic kidney disease: Followed by his primary care physician and nephrology  5. Hyperlipidemia: The patient is on a high intensity statin drug with atorvastatin 40 mg daily  Current  medicines are reviewed with the patient today.  The patient does not have concerns regarding medicines.  Labs/ tests ordered today include:   Orders Placed This Encounter  Procedures  . Comprehensive metabolic panel  . TSH   Disposition:   FU 4 months with a TSH and CMET for amiodarone monitoring  Signed, Sherren Mocha, MD  11/15/2016 12:47 PM    [facility] [place], [place], [place]  [address] Phone: [phone]; Fax: [phone]

## 2016-12-25 IMAGING — NM NM MISC PROCEDURE
3 series · 18 of 18 positions shown · non-contrast
Comparison: none

[Series 1: rest_(id)_sa · 6.4mm · 6.40mm/px · 6 of 64 frames shown]
[frame 6/64]
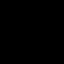
[frame 16/64]
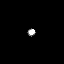
[frame 27/64]
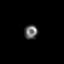
[frame 38/64]
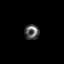
[frame 48/64]
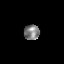
[frame 59/64]
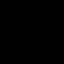

[Series 1: stress-gsp_(id)_sa · 6.4mm · 6.40mm/px · 6 of 512 frames shown]
[frame 43/512]
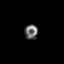
[frame 128/512]
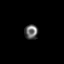
[frame 214/512]
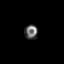
[frame 299/512]
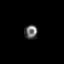
[frame 384/512]
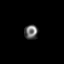
[frame 470/512]
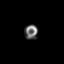

[Series 1: stress-sum-em_(id)_sa · 6.4mm · 6.40mm/px · 6 of 64 frames shown]
[frame 6/64]
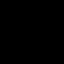
[frame 16/64]
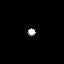
[frame 27/64]
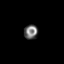
[frame 38/64]
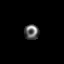
[frame 48/64]
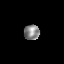
[frame 59/64]
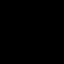

[18 of 18 positions shown; findings below may reference images not displayed]

Canned report from images found in remote index.

Refer to host system for actual result text.

## 2017-02-18 ENCOUNTER — Other Ambulatory Visit: Payer: Self-pay | Admitting: Physician Assistant

## 2017-02-18 DIAGNOSIS — I255 Ischemic cardiomyopathy: Secondary | ICD-10-CM

## 2017-02-19 NOTE — Telephone Encounter (Signed)
Refill Request.  

## 2017-03-06 ENCOUNTER — Encounter: Payer: Self-pay | Admitting: Cardiovascular Disease

## 2017-03-06 ENCOUNTER — Ambulatory Visit (INDEPENDENT_AMBULATORY_CARE_PROVIDER_SITE_OTHER): Payer: Medicare Other | Admitting: Cardiovascular Disease

## 2017-03-06 VITALS — BP 122/60 | HR 66 | Ht 71.0 in | Wt 193.4 lb

## 2017-03-06 DIAGNOSIS — I2510 Atherosclerotic heart disease of native coronary artery without angina pectoris: Secondary | ICD-10-CM

## 2017-03-06 DIAGNOSIS — I481 Persistent atrial fibrillation: Secondary | ICD-10-CM | POA: Diagnosis not present

## 2017-03-06 DIAGNOSIS — I255 Ischemic cardiomyopathy: Secondary | ICD-10-CM | POA: Diagnosis not present

## 2017-03-06 DIAGNOSIS — I5042 Chronic combined systolic (congestive) and diastolic (congestive) heart failure: Secondary | ICD-10-CM | POA: Diagnosis not present

## 2017-03-06 DIAGNOSIS — N183 Chronic kidney disease, stage 3 unspecified: Secondary | ICD-10-CM

## 2017-03-06 DIAGNOSIS — E785 Hyperlipidemia, unspecified: Secondary | ICD-10-CM | POA: Diagnosis not present

## 2017-03-06 DIAGNOSIS — I4819 Other persistent atrial fibrillation: Secondary | ICD-10-CM

## 2017-03-06 DIAGNOSIS — I25119 Atherosclerotic heart disease of native coronary artery with unspecified angina pectoris: Secondary | ICD-10-CM | POA: Diagnosis not present

## 2017-03-06 LAB — COMPREHENSIVE METABOLIC PANEL
A/G RATIO: 1.9 (ref 1.2–2.2)
ALBUMIN: 3.8 g/dL (ref 3.5–4.7)
ALK PHOS: 89 IU/L (ref 39–117)
ALT: 29 IU/L (ref 0–44)
AST: 29 IU/L (ref 0–40)
BILIRUBIN TOTAL: 0.3 mg/dL (ref 0.0–1.2)
BUN / CREAT RATIO: 16 (ref 10–24)
BUN: 47 mg/dL — AB (ref 8–27)
CHLORIDE: 103 mmol/L (ref 96–106)
CO2: 21 mmol/L (ref 20–29)
CREATININE: 3.03 mg/dL — AB (ref 0.76–1.27)
Calcium: 8.4 mg/dL — ABNORMAL LOW (ref 8.6–10.2)
GFR calc non Af Amer: 19 mL/min/{1.73_m2} — ABNORMAL LOW (ref >59)
GFR, EST AFRICAN AMERICAN: 21 mL/min/{1.73_m2} — AB (ref >59)
GLUCOSE: 252 mg/dL — AB (ref 65–99)
Globulin, Total: 2 g/dL (ref 1.5–4.5)
Potassium: 3.9 mmol/L (ref 3.5–5.2)
Sodium: 140 mmol/L (ref 134–144)
Total Protein: 5.8 g/dL — ABNORMAL LOW (ref 6.0–8.5)

## 2017-03-06 LAB — TSH: TSH: 4.16 u[IU]/mL (ref 0.450–4.500)
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
## 2017-03-06 NOTE — Patient Instructions (Signed)
Medication Instructions:  Your provider recommends that you continue on your current medications as directed. Please refer to the Current Medication list given to you today.    Labwork: TODAY: CMP, TSH  Testing/Procedures: None  Follow-Up: You have an appointment scheduled with Dr. Burt Knack on Wednesday, June 05, 2017 at 9:20AM.  Any Other Special Instructions Will Be Listed Below (If Applicable).     If you need a refill on your cardiac medications before your next appointment, please call your pharmacy.

## 2017-03-11 ENCOUNTER — Telehealth: Payer: Self-pay | Admitting: Cardiovascular Disease

## 2017-03-11 NOTE — Telephone Encounter (Signed)
Patient called requesting lab results. Advised patient would call him once Dr Burt Knack has reviewed

## 2017-03-11 NOTE — Telephone Encounter (Signed)
New message    Pt is calling about his lab results from last week.

## 2017-03-12 NOTE — Telephone Encounter (Signed)
See result note.  

## 2017-03-13 ENCOUNTER — Other Ambulatory Visit: Payer: Self-pay | Admitting: Cardiovascular Disease

## 2017-03-13 DIAGNOSIS — I25119 Atherosclerotic heart disease of native coronary artery with unspecified angina pectoris: Secondary | ICD-10-CM

## 2017-03-13 NOTE — Telephone Encounter (Signed)
-----   Message from Sherren Mocha, MD sent at 03/12/2017  9:59 PM EDT ----- Reviewed. Creatinine elevated from past but stable compared to recent hospitalization. TSH is ok. Would continue same Rx. Pt follows with nephrologist and internist at his home at [facility].

## 2017-03-13 NOTE — Telephone Encounter (Signed)
Informed patient of results and verbal understanding expressed.   Forwarded  To Dr. Bland Span (Nephrology).

## 2017-06-05 ENCOUNTER — Ambulatory Visit: Payer: Medicare Other | Admitting: Cardiovascular Disease

## 2017-06-25 ENCOUNTER — Encounter: Payer: Self-pay | Admitting: *Deleted

## 2017-06-25 DIAGNOSIS — Z006 Encounter for examination for normal comparison and control in clinical research program: Secondary | ICD-10-CM

## 2017-06-25 NOTE — Progress Notes (Signed)
TWILIGHT Research Study month 18 telephone visit completed. Patient is now on home dialysis (performed by wife) and is limited to travel. He has switched to a local cardiologist and very sad that he cant continue with Dr. Burt Knack. He is currently on plavix alone and has not had any bleeding issues since last year when he had the GI bleed. I thanked him for his participation in the TWILIGHT study.

## 2017-09-01 ENCOUNTER — Other Ambulatory Visit: Payer: Self-pay | Admitting: Physician Assistant

## 2017-11-30 ENCOUNTER — Other Ambulatory Visit: Payer: Self-pay | Admitting: Cardiovascular Disease

## 2017-11-30 DIAGNOSIS — I255 Ischemic cardiomyopathy: Secondary | ICD-10-CM

## 2018-02-27 ENCOUNTER — Other Ambulatory Visit: Payer: Self-pay | Admitting: Physician Assistant

## 2018-02-27 ENCOUNTER — Other Ambulatory Visit: Payer: Self-pay | Admitting: Cardiovascular Disease

## 2018-02-27 DIAGNOSIS — I255 Ischemic cardiomyopathy: Secondary | ICD-10-CM

## 2018-07-19 DEATH — deceased
